# Patient Record
Sex: Male | Born: 1965 | Race: White | Hispanic: No | Marital: Single | Smoking: Current every day smoker
Health system: Southern US, Community
[De-identification: ages and names within clinical notes are randomized; demographics above are authoritative.]

## PROBLEM LIST (undated history)

## (undated) DIAGNOSIS — S069X9A Unspecified intracranial injury with loss of consciousness of unspecified duration, initial encounter: Secondary | ICD-10-CM

## (undated) DIAGNOSIS — F319 Bipolar disorder, unspecified: Secondary | ICD-10-CM

## (undated) DIAGNOSIS — R519 Headache, unspecified: Secondary | ICD-10-CM

## (undated) DIAGNOSIS — E039 Hypothyroidism, unspecified: Secondary | ICD-10-CM

## (undated) DIAGNOSIS — F259 Schizoaffective disorder, unspecified: Secondary | ICD-10-CM

## (undated) DIAGNOSIS — R011 Cardiac murmur, unspecified: Secondary | ICD-10-CM

## (undated) DIAGNOSIS — S069XAA Unspecified intracranial injury with loss of consciousness status unknown, initial encounter: Secondary | ICD-10-CM

## (undated) DIAGNOSIS — R51 Headache: Secondary | ICD-10-CM

---

## 2014-10-19 ENCOUNTER — Emergency Department
Admission: EM | Admit: 2014-10-19 | Discharge: 2014-10-19 | Disposition: A | Discharge status: Other Acute Inpatient Hospital | Payer: Medicare Other | Attending: Emergency Medicine | Admitting: Emergency Medicine

## 2014-10-19 ENCOUNTER — Encounter: Payer: Self-pay | Admitting: Emergency Medicine

## 2014-10-19 ENCOUNTER — Inpatient Hospital Stay
Admit: 2014-10-19 | Discharge: 2014-10-21 | DRG: 885 | Disposition: A | Discharge status: Home / Self Care | Payer: Medicare Other | Attending: Psychiatry | Admitting: Psychiatry

## 2014-10-19 DIAGNOSIS — F17200 Nicotine dependence, unspecified, uncomplicated: Secondary | ICD-10-CM | POA: Diagnosis present

## 2014-10-19 DIAGNOSIS — F250 Schizoaffective disorder, bipolar type: Secondary | ICD-10-CM | POA: Diagnosis present

## 2014-10-19 DIAGNOSIS — Z818 Family history of other mental and behavioral disorders: Secondary | ICD-10-CM

## 2014-10-19 DIAGNOSIS — Z79899 Other long term (current) drug therapy: Secondary | ICD-10-CM | POA: Insufficient documentation

## 2014-10-19 DIAGNOSIS — T1491 Suicide attempt: Secondary | ICD-10-CM | POA: Diagnosis present

## 2014-10-19 DIAGNOSIS — Z79818 Long term (current) use of other agents affecting estrogen receptors and estrogen levels: Secondary | ICD-10-CM | POA: Diagnosis not present

## 2014-10-19 DIAGNOSIS — S069XAA Unspecified intracranial injury with loss of consciousness status unknown, initial encounter: Secondary | ICD-10-CM

## 2014-10-19 DIAGNOSIS — R45850 Homicidal ideations: Secondary | ICD-10-CM | POA: Diagnosis present

## 2014-10-19 DIAGNOSIS — F29 Unspecified psychosis not due to a substance or known physiological condition: Secondary | ICD-10-CM | POA: Diagnosis present

## 2014-10-19 DIAGNOSIS — F17210 Nicotine dependence, cigarettes, uncomplicated: Secondary | ICD-10-CM | POA: Diagnosis present

## 2014-10-19 DIAGNOSIS — Z720 Tobacco use: Secondary | ICD-10-CM | POA: Insufficient documentation

## 2014-10-19 DIAGNOSIS — S069X9A Unspecified intracranial injury with loss of consciousness of unspecified duration, initial encounter: Secondary | ICD-10-CM

## 2014-10-19 DIAGNOSIS — F259 Schizoaffective disorder, unspecified: Secondary | ICD-10-CM | POA: Diagnosis not present

## 2014-10-19 DIAGNOSIS — F1510 Other stimulant abuse, uncomplicated: Secondary | ICD-10-CM | POA: Insufficient documentation

## 2014-10-19 DIAGNOSIS — F1310 Sedative, hypnotic or anxiolytic abuse, uncomplicated: Secondary | ICD-10-CM | POA: Insufficient documentation

## 2014-10-19 DIAGNOSIS — Z87820 Personal history of traumatic brain injury: Secondary | ICD-10-CM | POA: Insufficient documentation

## 2014-10-19 DIAGNOSIS — Z046 Encounter for general psychiatric examination, requested by authority: Secondary | ICD-10-CM | POA: Diagnosis present

## 2014-10-19 HISTORY — DX: Unspecified intracranial injury with loss of consciousness of unspecified duration, initial encounter: S06.9X9A

## 2014-10-19 HISTORY — DX: Headache: R51

## 2014-10-19 HISTORY — DX: Cardiac murmur, unspecified: R01.1

## 2014-10-19 HISTORY — DX: Schizoaffective disorder, unspecified: F25.9

## 2014-10-19 HISTORY — DX: Unspecified intracranial injury with loss of consciousness status unknown, initial encounter: S06.9XAA

## 2014-10-19 HISTORY — DX: Headache, unspecified: R51.9

## 2014-10-19 LAB — URINE DRUG SCREEN, QUALITATIVE (ARMC ONLY)
AMPHETAMINES, UR SCREEN: NOT DETECTED
BENZODIAZEPINE, UR SCRN: NOT DETECTED
Barbiturates, Ur Screen: NOT DETECTED
Cannabinoid 50 Ng, Ur ~~LOC~~: NOT DETECTED
Cocaine Metabolite,Ur ~~LOC~~: NOT DETECTED
MDMA (Ecstasy)Ur Screen: POSITIVE — AB
METHADONE SCREEN, URINE: NOT DETECTED
Opiate, Ur Screen: NOT DETECTED
Phencyclidine (PCP) Ur S: NOT DETECTED
Tricyclic, Ur Screen: POSITIVE — AB

## 2014-10-19 LAB — ACETAMINOPHEN LEVEL

## 2014-10-19 LAB — COMPREHENSIVE METABOLIC PANEL
ALBUMIN: 4.3 g/dL (ref 3.5–5.0)
ALT: 12 U/L — ABNORMAL LOW (ref 17–63)
ANION GAP: 7 (ref 5–15)
AST: 23 U/L (ref 15–41)
Alkaline Phosphatase: 71 U/L (ref 38–126)
BILIRUBIN TOTAL: 0.5 mg/dL (ref 0.3–1.2)
BUN: 18 mg/dL (ref 6–20)
CO2: 24 mmol/L (ref 22–32)
Calcium: 9.3 mg/dL (ref 8.9–10.3)
Chloride: 108 mmol/L (ref 101–111)
Creatinine, Ser: 0.94 mg/dL (ref 0.61–1.24)
GFR calc Af Amer: >60 mL/min (ref >60)
Glucose, Bld: 97 mg/dL (ref 65–99)
POTASSIUM: 3.5 mmol/L (ref 3.5–5.1)
Sodium: 139 mmol/L (ref 135–145)
TOTAL PROTEIN: 6.9 g/dL (ref 6.5–8.1)

## 2014-10-19 LAB — CBC
HCT: 42.9 % (ref 40.0–52.0)
Hemoglobin: 14.4 g/dL (ref 13.0–18.0)
MCH: 30.4 pg (ref 26.0–34.0)
MCHC: 33.5 g/dL (ref 32.0–36.0)
MCV: 90.7 fL (ref 80.0–100.0)
PLATELETS: 173 10*3/uL (ref 150–440)
RBC: 4.73 MIL/uL (ref 4.40–5.90)
RDW: 13.3 % (ref 11.5–14.5)
WBC: 11 10*3/uL — AB (ref 3.8–10.6)

## 2014-10-19 LAB — SALICYLATE LEVEL

## 2014-10-19 LAB — ETHANOL

## 2014-10-19 MED ORDER — PALIPERIDONE ER 3 MG PO TB24
6.0000 mg | ORAL_TABLET | Freq: Every day | ORAL | Status: DC
  Administered 2014-10-19: 6 mg via ORAL
  Filled 2014-10-19: qty 2
  Filled 2014-10-19: qty 1

## 2014-10-19 MED ORDER — HYDROXYZINE HCL 25 MG PO TABS
50.0000 mg | ORAL_TABLET | Freq: Three times a day (TID) | ORAL | Status: DC | PRN
  Administered 2014-10-19: 50 mg via ORAL
  Filled 2014-10-19: qty 2

## 2014-10-19 MED ORDER — LAMOTRIGINE 25 MG PO TABS
100.0000 mg | ORAL_TABLET | Freq: Two times a day (BID) | ORAL | Status: DC
  Administered 2014-10-19 (×2): 100 mg via ORAL
  Filled 2014-10-19: qty 4
  Filled 2014-10-19 (×4): qty 1

## 2014-10-19 MED ORDER — NICOTINE 10 MG IN INHA
1.0000 | RESPIRATORY_TRACT | Status: DC | PRN
  Administered 2014-10-19: 1 via RESPIRATORY_TRACT

## 2014-10-19 MED ORDER — MIRTAZAPINE 15 MG PO TABS
30.0000 mg | ORAL_TABLET | Freq: Every day | ORAL | Status: DC
Start: 2014-10-19 — End: 2014-10-19
  Administered 2014-10-19: 30 mg via ORAL
  Filled 2014-10-19: qty 1
  Filled 2014-10-19: qty 2
  Filled 2014-10-19: qty 1

## 2014-10-19 MED ORDER — NICOTINE 10 MG IN INHA
RESPIRATORY_TRACT | Status: AC
Start: 2014-10-19 — End: 2014-10-19
  Administered 2014-10-19: 168
  Filled 2014-10-19: qty 36

## 2014-10-19 MED ORDER — LORAZEPAM 1 MG PO TABS
ORAL_TABLET | ORAL | Status: DC
Start: 2014-10-19 — End: 2014-10-19
  Filled 2014-10-19: qty 1

## 2014-10-19 MED ORDER — OXCARBAZEPINE 300 MG PO TABS
600.0000 mg | ORAL_TABLET | Freq: Two times a day (BID) | ORAL | Status: DC
  Administered 2014-10-19 (×2): 600 mg via ORAL
  Filled 2014-10-19 (×6): qty 2

## 2014-10-19 NOTE — ED Notes (Signed)
Pt. transfered to [facility] without incident after report from. Placed in room and oriented to unit. Pt. informed that for their safety all care areas are designed for safety and monitored by security cameras at all times; and visiting hours explained to patient. Patient verbalizes understanding, and verbal contract for safety obtained.

## 2014-10-19 NOTE — ED Notes (Signed)
Am meds administered as ordered    Appropriate to stimulation  No verbalized needs or concerns at this time  NAD assessed  Continue to monitor 

## 2014-10-19 NOTE — ED Notes (Signed)

## 2014-10-19 NOTE — Consult Note (Signed)
[facility] Face-to-Face Psychiatry Consult   Reason for Consult:  Consult for this 49 year old man referred from his group home with homicidal ideation Referring Physician:  Marcelene Butte Patient Identification: Jaime Vargas MRN:  [id number] Principal Diagnosis: Schizoaffective disorder Diagnosis:   Patient Active Problem List   Diagnosis Date Noted  . Schizoaffective disorder [F25.9] 10/19/2014  . Traumatic brain injury [S06.9X0A] 10/19/2014    Total Time spent with patient: 1.5 hours  Subjective:   Jaime Vargas is a 49 y.o. male patient admitted with "they are trying to get my stuff".  HPI:  Information from the patient and the chart. This 48 year old man with a history of chronic mental health problems was referred from his group home. He states that he can't stand being at the group home anymore because they are trying to steal from him. Today he threatened to kill everyone at the group home. He doesn't actually have a weapon but he made multiple very agitated threats. He is convinced that people are jealous of him and are trying to steal his belongings from him. He says that he is going to get out of that group home in matter what he has to do. Denies any suicidal ideation. He has been sleeping poorly recently. Appetite is fine. Compliant with medicine. Denies alcohol or drug use. Has been living in the current group home for only a couple of months. No clear precipitant to his worsening paranoia.  Long-standing chronic mental health problems evidently. Diagnosis of schizoaffective disorder. Patient also reports a severe brain injury at age 61 which may be a major part of his ongoing symptoms. No history of suicide attempts. Denies that he is ever actually been violent anyone in the past. Has had several hospitalizations in the past.  Social history: Patient is unclear as to whether he has a guardian or not. He is currently residing at a local group home.  Medical history: Has a history of a brain  injury at age 28. He is on some anticonvulsives but it's not clear to me whether he's ever actually had seizures he doesn't give a clear history about that. No other obvious medical problems.  Substance abuse history: Says he doesn't drink or use drugs and never had any problem with alcohol or drugs.  Family history: Denies any family history of mental illness  Current medication: Lamotrigine, Remeron, Trileptal, in [place]   HPI Elements:   Quality:  Homicidal ideation threats and anger. Severity:  Severe potentially life threatening. Timing:  Long-standing chronic issue but with worsening in the last few days. Duration:  Ongoing threatening behavior. Context:  Chronic paranoia.  Past Medical History:  Past Medical History  Diagnosis Date  . Schizoaffective disorder   . Traumatic brain injury    History reviewed. No pertinent past surgical history. Family History: No family history on file. Social History:  History  Alcohol Use No     History  Drug Use No    Comment: Patient reports past use    Social History   Social History  . Marital Status: Divorced    Spouse Name: N/A  . Number of Children: N/A  . Years of Education: N/A   Social History Main Topics  . Smoking status: Current Every Day Smoker -- 2.00 packs/day  . Smokeless tobacco: Never Used  . Alcohol Use: No  . Drug Use: No     Comment: Patient reports past use  . Sexual Activity: Not Asked   Other Topics Concern  . None   Social  History Narrative  . None   Additional Social History:    History of alcohol / drug use?: No history of alcohol / drug abuse                     Allergies:  No Known Allergies  Labs:  Results for orders placed or performed during the hospital encounter of 10/19/14 (from the past 48 hour(s))  Comprehensive metabolic panel     Status: Abnormal   Collection Time: 10/19/14 12:31 AM  Result Value Ref Range   Sodium 139 135 - 145 mmol/L   Potassium 3.5 3.5 - 5.1 mmol/L    Chloride 108 101 - 111 mmol/L   CO2 24 22 - 32 mmol/L   Glucose, Bld 97 65 - 99 mg/dL   BUN 18 6 - 20 mg/dL   Creatinine, Ser 0.94 0.61 - 1.24 mg/dL   Calcium 9.3 8.9 - 10.3 mg/dL   Total Protein 6.9 6.5 - 8.1 g/dL   Albumin 4.3 3.5 - 5.0 g/dL   AST 23 15 - 41 U/L   ALT 12 (L) 17 - 63 U/L   Alkaline Phosphatase 71 38 - 126 U/L   Total Bilirubin 0.5 0.3 - 1.2 mg/dL   GFR calc non Af Amer >60 >60 mL/min   GFR calc Af Amer >60 >60 mL/min    Comment: (NOTE) The eGFR has been calculated using the CKD EPI equation. This calculation has not been validated in all clinical situations. eGFR's persistently <60 mL/min signify possible Chronic Kidney Disease.    Anion gap 7 5 - 15  Ethanol (ETOH)     Status: None   Collection Time: 10/19/14 12:31 AM  Result Value Ref Range   Alcohol, Ethyl (B) <5 <5 mg/dL    Comment:        LOWEST DETECTABLE LIMIT FOR SERUM ALCOHOL IS 5 mg/dL FOR MEDICAL PURPOSES ONLY   Salicylate level     Status: None   Collection Time: 10/19/14 12:31 AM  Result Value Ref Range   Salicylate Lvl <5.4 2.8 - 30.0 mg/dL  Acetaminophen level     Status: Abnormal   Collection Time: 10/19/14 12:31 AM  Result Value Ref Range   Acetaminophen (Tylenol), Serum <10 (L) 10 - 30 ug/mL    Comment:        THERAPEUTIC CONCENTRATIONS VARY SIGNIFICANTLY. A RANGE OF 10-30 ug/mL MAY BE AN EFFECTIVE CONCENTRATION FOR MANY PATIENTS. HOWEVER, SOME ARE BEST TREATED AT CONCENTRATIONS OUTSIDE THIS RANGE. ACETAMINOPHEN CONCENTRATIONS >150 ug/mL AT 4 HOURS AFTER INGESTION AND >50 ug/mL AT 12 HOURS AFTER INGESTION ARE OFTEN ASSOCIATED WITH TOXIC REACTIONS.   CBC     Status: Abnormal   Collection Time: 10/19/14 12:31 AM  Result Value Ref Range   WBC 11.0 (H) 3.8 - 10.6 K/uL   RBC 4.73 4.40 - 5.90 MIL/uL   Hemoglobin 14.4 13.0 - 18.0 g/dL   HCT 42.9 40.0 - 52.0 %   MCV 90.7 80.0 - 100.0 fL   MCH 30.4 26.0 - 34.0 pg   MCHC 33.5 32.0 - 36.0 g/dL   RDW 13.3 11.5 - 14.5 %    Platelets 173 150 - 440 K/uL  Urine Drug Screen, Qualitative ([place] only)     Status: Abnormal   Collection Time: 10/19/14 12:31 AM  Result Value Ref Range   Tricyclic, Ur Screen POSITIVE (A) NONE DETECTED   Amphetamines, Ur Screen NONE DETECTED NONE DETECTED   MDMA (Ecstasy)Ur Screen POSITIVE (A) NONE DETECTED  Cocaine Metabolite,Ur [place] NONE DETECTED NONE DETECTED   Opiate, Ur Screen NONE DETECTED NONE DETECTED   Phencyclidine (PCP) Ur S NONE DETECTED NONE DETECTED   Cannabinoid 50 Ng, Ur [place] NONE DETECTED NONE DETECTED   Barbiturates, Ur Screen NONE DETECTED NONE DETECTED   Benzodiazepine, Ur Scrn NONE DETECTED NONE DETECTED   Methadone Scn, Ur NONE DETECTED NONE DETECTED    Comment: (NOTE) 875  Tricyclics, urine               Cutoff 1000 ng/mL 200  Amphetamines, urine             Cutoff 1000 ng/mL 300  MDMA (Ecstasy), urine           Cutoff 500 ng/mL 400  Cocaine Metabolite, urine       Cutoff 300 ng/mL 500  Opiate, urine                   Cutoff 300 ng/mL 600  Phencyclidine (PCP), urine      Cutoff 25 ng/mL 700  Cannabinoid, urine              Cutoff 50 ng/mL 800  Barbiturates, urine             Cutoff 200 ng/mL 900  Benzodiazepine, urine           Cutoff 200 ng/mL 1000 Methadone, urine                Cutoff 300 ng/mL 1100 1200 The urine drug screen provides only a preliminary, unconfirmed 1300 analytical test result and should not be used for non-medical 1400 purposes. Clinical consideration and professional judgment should 1500 be applied to any positive drug screen result due to possible 1600 interfering substances. A more specific alternate chemical method 1700 must be used in order to obtain a confirmed analytical result.  1800 Gas chromato graphy / mass spectrometry (GC/MS) is the preferred 1900 confirmatory method.     Vitals: Blood pressure 114/82, pulse 82, temperature 97.6 F (36.4 C), temperature source Oral, resp. rate 18, height _0  (1.753 m), weight 66.821 kg  (147 lb 5 oz), SpO2 98 %.  Risk to Self: Suicidal Ideation: No Suicidal Intent: No Is patient at risk for suicide?: No Suicidal Plan?: No Access to Means: No What has been your use of drugs/alcohol within the last 12 months?: None reported How many times?: 0 Other Self Harm Risks: None reported Triggers for Past Attempts: None known Intentional Self Injurious Behavior: None Risk to Others: Homicidal Ideation: No Thoughts of Harm to Others: No Current Homicidal Intent: No Current Homicidal Plan: No Access to Homicidal Means: No Identified Victim: N/A History of harm to others?: No Assessment of Violence: None Noted Violent Behavior Description: N/A Does patient have access to weapons?: No Criminal Charges Pending?: No Does patient have a court date: No Prior Inpatient Therapy: Prior Inpatient Therapy: No Prior Therapy Dates: N/A Prior Therapy Facilty/Provider(s): N/A Reason for Treatment: N/A Prior Outpatient Therapy: Prior Outpatient Therapy: No Prior Therapy Dates: N/A Prior Therapy Facilty/Provider(s): N/A Reason for Treatment: N/A Does patient have an ACCT team?: No Does patient have Intensive In-House Services?  : No Does patient have [facility] services? : No Does patient have P4CC services?: No  Current Facility-Administered Medications  Medication Dose Route Frequency Provider Last Rate Last Dose  . hydrOXYzine (ATARAX/VISTARIL) tablet 50 mg  50 mg Oral TID PRN Joanne Gavel, MD   50 mg at 10/19/14 0818  . lamoTRIgine (LAMICTAL)  tablet 100 mg  100 mg Oral BID Joanne Gavel, MD   100 mg at 10/19/14 1018  . mirtazapine (REMERON) tablet 30 mg  30 mg Oral QHS Joanne Gavel, MD      . nicotine (NICOTROL) 10 MG inhaler 1 continuous puffing  1 continuous puffing Inhalation PRN Paulette Blanch, MD   1 continuous puffing at 10/19/14 1726  . Oxcarbazepine (TRILEPTAL) tablet 600 mg  600 mg Oral BID Joanne Gavel, MD   600 mg at 10/19/14 1016  . paliperidone (INVEGA) 24 hr tablet 6 mg   6 mg Oral Daily Joanne Gavel, MD   6 mg at 10/19/14 1017   Current Outpatient Prescriptions  Medication Sig Dispense Refill  . hydrOXYzine (ATARAX/VISTARIL) 50 MG tablet Take 50 mg by mouth 3 (three) times daily as needed.    . lamoTRIgine (LAMICTAL) 100 MG tablet Take 100 mg by mouth 2 (two) times daily.    . mirtazapine (REMERON) 30 MG tablet Take 30 mg by mouth at bedtime.    Marland Kitchen oxcarbazepine (TRILEPTAL) 600 MG tablet Take 600 mg by mouth 2 (two) times daily.    . paliperidone (INVEGA) 6 MG 24 hr tablet Take 6 mg by mouth daily.      Musculoskeletal: Strength & Muscle Tone: within normal limits Gait & Station: normal Patient leans: N/A  Psychiatric Specialty Exam: Physical Exam  Nursing note and vitals reviewed. Constitutional: He appears well-developed and well-nourished.  HENT:  Head: Atraumatic.    Eyes: Conjunctivae are normal. Pupils are equal, round, and reactive to light.  Neck: Normal range of motion.  Cardiovascular: Normal heart sounds.   Respiratory: Effort normal.  GI: Soft.  Musculoskeletal: Normal range of motion.  Neurological: He is alert.  Skin: Skin is warm and dry.  Psychiatric: His mood appears anxious. His affect is labile. His speech is rapid and/or pressured. He is agitated. Thought content is paranoid. Cognition and memory are impaired. He expresses impulsivity. He expresses homicidal ideation. He exhibits abnormal recent memory and abnormal remote memory.    Review of Systems  Constitutional: Negative.   HENT: Negative.   Eyes: Negative.   Respiratory: Negative.   Cardiovascular: Negative.   Gastrointestinal: Negative.   Musculoskeletal: Negative.   Skin: Negative.   Neurological: Negative.   Psychiatric/Behavioral: Positive for hallucinations and memory loss. Negative for depression, suicidal ideas and substance abuse. The patient is nervous/anxious and has insomnia.     Blood pressure 114/82, pulse 82, temperature 97.6 F (36.4 C),  temperature source Oral, resp. rate 18, height _0  (1.753 m), weight 66.821 kg (147 lb 5 oz), SpO2 98 %.Body mass index is 21.74 kg/(m^2).  General Appearance: Casual  Eye Contact::  Good  Speech:  Pressured  Volume:  Increased  Mood:  Angry  Affect:  Congruent  Thought Process:  Circumstantial  Orientation:  Full (Time, Place, and Person)  Thought Content:  Delusions and Paranoid Ideation  Suicidal Thoughts:  No  Homicidal Thoughts:  Yes.  without intent/plan  Memory:  Immediate;   Fair Recent;   Poor Remote;   Poor  Judgement:  Impaired  Insight:  Lacking  Psychomotor Activity:  Increased  Concentration:  Poor  Recall:  Poor  Fund of Knowledge:Poor  Language: Poor  Akathisia:  No  Handed:  Right  AIMS (if indicated):     Assets:  Housing Resilience  ADL's:  Intact  Cognition: Impaired,  Mild  Sleep:      Medical Decision Making: Review  of Psycho-Social Stressors (1), Review or order clinical lab tests (1), Established Problem, Worsening (2) and Review of Medication Regimen & Side Effects (2)  Treatment Plan Summary: Daily contact with patient to assess and evaluate symptoms and progress in treatment, Medication management and Plan Patient is agitated and paranoid. Delusional. Tells me that he has $250 million. Seems to sometimes just confabulate things. Pretty agitated and repeats his threats if he has to go back to the group home. Patient will be admitted to the psychiatry service the diagnosis of schizoaffective disorder. 15 minute checks in place. Continue current medicine. Treatment team downstairs can work with him on further medication adjustments and further planning. Continue IVC  Plan:  Recommend psychiatric Inpatient admission when medically cleared. Supportive therapy provided about ongoing stressors. Disposition: Admit to psychiatric ward  Nunez 10/19/2014 8:04 PM

## 2014-10-19 NOTE — ED Notes (Signed)
BEHAVIORAL HEALTH ROUNDING Patient sleeping: No. Patient alert and oriented: yes Behavior appropriate: Yes.  ; If no, describe:  Nutrition and fluids offered: yes Toileting and hygiene offered: Yes  Sitter present: q15 minute observations and security camera monitoring Law enforcement present: Yes  [facility]  

## 2014-10-19 NOTE — ED Notes (Signed)
Breakfast provided along with grape juice   introduced myself to him and he was very polite   He denies pain  Assessment completed  Psych consult pending  VSS

## 2014-10-19 NOTE — ED Notes (Signed)
Report called to beh med RN,  Pt admitted.

## 2014-10-19 NOTE — ED Notes (Signed)
BEHAVIORAL HEALTH ROUNDING Patient sleeping: No. Patient alert and oriented: yes Behavior appropriate: Yes.   Nutrition and fluids offered: Yes  Toileting and hygiene offered: Yes  Sitter present: q15 min observations Law enforcement present: Yes [employer]  ENVIRONMENTAL ASSESSMENT Potentially harmful objects out of patient reach: Yes.   Personal belongings secured: Yes.   Patient dressed in hospital provided attire only: Yes.   Plastic bags out of patient reach: Yes.   Patient care equipment (cords, cables, call bells, lines, and drains) shortened, removed, or accounted for: Yes.   Equipment and supplies removed from bottom of stretcher: Yes.   Potentially toxic materials out of patient reach: Yes.   Sharps container removed or out of patient reach: Yes.   ED BHU PLACEMENT JUSTIFICATION Is the patient under IVC or is there intent for IVC: Yes.    Is IVC current? Yes Is the patient medically cleared: Yes.   Is there vacancy in the ED BHU: Yes.   Is the population mix appropriate for patient: Yes.   Is the patient awaiting placement in inpatient or outpatient setting: return to group home.   Has the patient had a psychiatric consult:    Survey of unit performed for contraband, proper placement and condition of furniture, tampering with fixtures in bathroom, shower, and each patient room: Yes.  ; Findings: none APPEARANCE/BEHAVIOR Cooperative,calm NEURO ASSESSMENT Orientation: A&O x3 Hallucinations: none noted at this time Speech: Normal Gait: normal RESPIRATORY ASSESSMENT Breathing Pattern-regular, no respiratory distress noted CARDIOVASCULAR ASSESSMENT Skin color appropriate for age and race GASTROINTESTINAL ASSESSMENT no GI distress noted EXTREMITIES Moves all extremities, no distress noted PLAN OF CARE Provide calm/safe environment. Vital signs assessed twice daily. ED BHU Assessment once each 12-hour shift. Collaborate with intake RN daily or as condition indicates.  Assure the ED provider has rounded once each shift. Provide and encourage hygiene. Provide redirection as needed. Assess for escalating behavior; address immediately and inform ED provider.  Assess family dynamic and appropriateness for visitation as needed: Yes.  Educate the patient/family about BHU procedures/visitation: Yes.

## 2014-10-19 NOTE — ED Notes (Signed)
Report received from Beth RN. Patient care assumed. Patient/RN introduction complete. Will continue to monitor.  

## 2014-10-19 NOTE — ED Notes (Signed)
Patient resting comfortably in room. No complaints or concerns voiced. No distress or abnormal behavior noted. Will continue to monitor with security cameras. Q 15 minute rounds continue. 

## 2014-10-19 NOTE — ED Notes (Signed)

## 2014-10-19 NOTE — ED Notes (Signed)
Pt calm  And cooperative at this time without complaints, will continue to monitor.  

## 2014-10-19 NOTE — ED Notes (Signed)
He is standing in the doorway of his room during report  - he appears angry  History of TBI

## 2014-10-19 NOTE — ED Notes (Signed)
Pt given breakfast tray

## 2014-10-19 NOTE — ED Notes (Signed)

## 2014-10-19 NOTE — ED Notes (Signed)
Pt brought in with [place] sherriff with ivc. Pt a resident of [facility] in [place]. Per petition, pt is threatening other residents. Pt with history of szhizoaffective disorder. Pt denies si or hi.
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
## 2014-10-19 NOTE — ED Notes (Signed)
Patient reports not feeling angry any more and states he know longer feels like he would hurt staff.  MD notified.

## 2014-10-19 NOTE — ED Notes (Signed)

## 2014-10-19 NOTE — ED Notes (Signed)
Report called to RN Raquel in [place].   Pt moved to [facility] with ED Thana Ates and Officer.

## 2014-10-19 NOTE — ED Notes (Signed)
Lunch provided along with an extra drink   Appropriate to stimulation  No verbalized needs or concerns at this time  NAD assessed  Continue to monitor 

## 2014-10-19 NOTE — ED Notes (Signed)
He continues to talk loud to the officers and gentle to medical staff  - officer encouraged to stop staring at him and talking back to him  Pt reassured

## 2014-10-19 NOTE — ED Notes (Signed)
He is to be admitted to [facility] when staff/bed become available

## 2014-10-19 NOTE — ED Notes (Signed)
BEHAVIORAL HEALTH ROUNDING Patient sleeping: No. Patient alert and oriented: yes Behavior appropriate: Yes.  ; If no, describe: Patient initially hostile with staff, but calmed after a few minutes Nutrition and fluids offered: Yes  Toileting and hygiene offered: Yes  Sitter present: no Law enforcement present: Yes

## 2014-10-19 NOTE — ED Notes (Signed)
Supper provided along with an extra drink    Appropriate to stimulation  No verbalized needs or concerns at this time  NAD assessed  Continue to monitor 

## 2014-10-19 NOTE — ED Notes (Signed)
BEHAVIORAL HEALTH ROUNDING Patient sleeping: no Patient alert and oriented: yes Behavior appropriate: Yes.  ; If no, describe:  Nutrition and fluids offered: No Toileting and hygiene offered: Yes  Sitter present: no Law enforcement present: Yes

## 2014-10-19 NOTE — ED Notes (Signed)
ED BHU PLACEMENT JUSTIFICATION Is the patient under IVC or is there intent for IVC: Yes.   Is the patient medically cleared: Yes.   Is there vacancy in the ED BHU: Yes.   Is the population mix appropriate for patient: Yes.   Is the patient awaiting placement in inpatient or outpatient setting: No. Has the patient had a psychiatric consult: consult pending Survey of unit performed for contraband, proper placement and condition of furniture, tampering with fixtures in bathroom, shower, and each patient room: Yes.  ; Findings:  APPEARANCE/BEHAVIOR Calm and cooperative NEURO ASSESSMENT Orientation: oriented x3  Denies pain Hallucinations: No.None noted (Hallucinations) Speech: Normal Gait: normal RESPIRATORY ASSESSMENT even unlabored respirations  CARDIOVASCULAR ASSESSMENT Pulses equal  regular rate  Skin warm and dry GASTROINTESTINAL ASSESSMENT no GI complaint EXTREMITIES Full ROM PLAN OF CARE Provide calm/safe environment. Vital signs assessed twice daily. ED BHU Assessment once each 12-hour shift. Collaborate with intake RN daily or as condition indicates. Assure the ED provider has rounded once each shift. Provide and encourage hygiene. Provide redirection as needed. Assess for escalating behavior; address immediately and inform ED provider.  Assess family dynamic and appropriateness for visitation as needed: Yes.  ; If necessary, describe findings:  Educate the patient/family about BHU procedures/visitation: Yes.  ; If necessary, describe findings:   

## 2014-10-19 NOTE — ED Notes (Signed)
BEHAVIORAL HEALTH ROUNDING Patient sleeping: No. Patient alert and oriented: yes Behavior appropriate: Yes.  ; If no, describe:  Nutrition and fluids offered: No Toileting and hygiene offered: Yes  Sitter present: no Law enforcement present: Yes  

## 2014-10-19 NOTE — ED Notes (Signed)
md Clapacs is consulting at this time   

## 2014-10-19 NOTE — ED Notes (Signed)
Report received from RN Amy T  Pt in doorway of room watching activity around QUAD area. No complaints or concerns voiced at this time. No abnormal behavior noted at this time. Will continue to monitor with q15 min checks. ODS officer in area.
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
## 2014-10-19 NOTE — ED Notes (Signed)
Called pharmacy for remaining meds  -  PRN med administered due to he has been pacing circles around his bed in his room - at times yelling  "F--k you - I am gonna wait till daylight and then I am gonna run - go get my shoes - whoever will not bring me my shoes can go to hell."

## 2014-10-19 NOTE — ED Notes (Addendum)
BEHAVIORAL HEALTH ROUNDING Patient sleeping: No. Patient alert and oriented: yes Behavior appropriate: no  ; If no, describe:  Agitated - talking loud at officers by the door  Nutrition and fluids offered: yes Toileting and hygiene offered: Yes  Sitter present: q15 minute observations and security camera monitoring Law enforcement present: Yes  ODS  ENVIRONMENTAL ASSESSMENT Potentially harmful objects out of patient reach: Yes.   Personal belongings secured: Yes.   Patient dressed in hospital provided attire only: Yes.   Plastic bags out of patient reach: Yes.   Patient care equipment (cords, cables, call bells, lines, and drains) shortened, removed, or accounted for: Yes.   Equipment and supplies removed from bottom of stretcher: Yes.   Potentially toxic materials out of patient reach: Yes.   Sharps container removed or out of patient reach: Yes.

## 2014-10-19 NOTE — ED Notes (Signed)
Patient assigned to appropriate care area. Patient oriented to unit/care area: Informed that, for their safety, care areas are designed for safety and monitored by security cameras at all times; and visiting hours explained to patient. Patient verbalizes understanding, and verbal contract for safety obtained. 

## 2014-10-20 DIAGNOSIS — F250 Schizoaffective disorder, bipolar type: Principal | ICD-10-CM

## 2014-10-20 DIAGNOSIS — F17200 Nicotine dependence, unspecified, uncomplicated: Secondary | ICD-10-CM | POA: Diagnosis present

## 2014-10-20 MED ORDER — OXCARBAZEPINE 300 MG PO TABS
600.0000 mg | ORAL_TABLET | Freq: Two times a day (BID) | ORAL | Status: DC
  Administered 2014-10-20 – 2014-10-21 (×3): 600 mg via ORAL
  Filled 2014-10-20 (×3): qty 2

## 2014-10-20 MED ORDER — DIVALPROEX SODIUM 500 MG PO DR TAB
500.0000 mg | DELAYED_RELEASE_TABLET | Freq: Two times a day (BID) | ORAL | Status: DC
  Administered 2014-10-20 – 2014-10-21 (×2): 500 mg via ORAL
  Filled 2014-10-20 (×2): qty 1

## 2014-10-20 MED ORDER — LAMOTRIGINE 100 MG PO TABS: 100.0000 mg | ORAL_TABLET | Freq: Every day | ORAL | Status: DC

## 2014-10-20 MED ORDER — ACETAMINOPHEN 325 MG PO TABS: 650.0000 mg | ORAL_TABLET | Freq: Four times a day (QID) | ORAL | Status: DC | PRN

## 2014-10-20 MED ORDER — LORAZEPAM 2 MG PO TABS
2.0000 mg | ORAL_TABLET | Freq: Once | ORAL | Status: AC
  Administered 2014-10-20: 2 mg via ORAL
  Filled 2014-10-20: qty 1

## 2014-10-20 MED ORDER — DIPHENHYDRAMINE HCL 25 MG PO CAPS
50.0000 mg | ORAL_CAPSULE | Freq: Once | ORAL | Status: AC
  Administered 2014-10-20: 50 mg via ORAL
  Filled 2014-10-20: qty 2

## 2014-10-20 MED ORDER — ALUM & MAG HYDROXIDE-SIMETH 200-200-20 MG/5ML PO SUSP: 30.0000 mL | ORAL | Status: DC | PRN

## 2014-10-20 MED ORDER — PALIPERIDONE ER 3 MG PO TB24
6.0000 mg | ORAL_TABLET | Freq: Every day | ORAL | Status: DC
  Administered 2014-10-20 – 2014-10-21 (×2): 6 mg via ORAL
  Filled 2014-10-20 (×2): qty 2

## 2014-10-20 MED ORDER — HALOPERIDOL 5 MG PO TABS
5.0000 mg | ORAL_TABLET | Freq: Once | ORAL | Status: AC
  Administered 2014-10-20: 5 mg via ORAL
  Filled 2014-10-20: qty 1

## 2014-10-20 MED ORDER — MAGNESIUM HYDROXIDE 400 MG/5ML PO SUSP: 30.0000 mL | Freq: Every day | ORAL | Status: DC | PRN

## 2014-10-20 MED ORDER — HYDROXYZINE HCL 50 MG PO TABS
50.0000 mg | ORAL_TABLET | Freq: Three times a day (TID) | ORAL | Status: DC | PRN
  Administered 2014-10-20: 50 mg via ORAL
  Filled 2014-10-20: qty 1

## 2014-10-20 MED ORDER — MIRTAZAPINE 30 MG PO TABS
30.0000 mg | ORAL_TABLET | Freq: Every day | ORAL | Status: DC
  Administered 2014-10-20: 30 mg via ORAL
  Filled 2014-10-20: qty 1

## 2014-10-20 NOTE — Tx Team (Signed)
Initial Interdisciplinary Treatment Plan   PATIENT STRESSORS: Health problems Substance abuse   PATIENT STRENGTHS: Ability for insight Motivation for treatment/growth   PROBLEM LIST: Problem List/Patient Goals Date to be addressed Date deferred Reason deferred Estimated date of resolution  Depression 10/20/14     Substance abuse 10/20/14                                                DISCHARGE CRITERIA:  Adequate post-discharge living arrangements Verbal commitment to aftercare and medication compliance  PRELIMINARY DISCHARGE PLAN: Return to previous living arrangement  PATIENT/FAMIILY INVOLVEMENT: This treatment plan has been presented to and reviewed with the patient, Jaime Vargas, and/or family member.  The patient and family have been given the opportunity to ask questions and make suggestions.  Foster Simpson 10/20/2014, 2:44 AM
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
## 2014-10-20 NOTE — [facility] Suicide Risk Assessment (Signed)
[facility] Admission Suicide Risk Assessment   Nursing information obtained from:    Demographic factors:    Current Mental Status:    Loss Factors:    Historical Factors:    Risk Reduction Factors:    Total Time spent with patient: 1 hour Principal Problem: Schizoaffective disorder, bipolar type Diagnosis:   Patient Active Problem List   Diagnosis Date Noted  . Tobacco use disorder [Z72.0] 10/20/2014  . Schizoaffective disorder, bipolar type [F25.0] 10/19/2014  . Traumatic brain injury [S06.9X0A] 10/19/2014     Continued Clinical Symptoms:  Alcohol Use Disorder Identification Test Final Score (AUDIT): 0 The "Alcohol Use Disorders Identification Test", Guidelines for Use in Primary Care, Second Edition.  World Science writer [facility]). Score between 0-7:  no or low risk or alcohol related problems. Score between 8-15:  moderate risk of alcohol related problems. Score between 16-19:  high risk of alcohol related problems. Score 20 or above:  warrants further diagnostic evaluation for alcohol dependence and treatment.   CLINICAL FACTORS:   Schizophrenia:   Depressive state Paranoid or undifferentiated type   Musculoskeletal: Strength & Muscle Tone: within normal limits Gait & Station: normal Patient leans: N/A  Psychiatric Specialty Exam: Physical exam performed in the emergency room and agree with the findings. Physical Exam  Nursing note and vitals reviewed.   Review of Systems  All other systems reviewed and are negative.   Blood pressure 102/70, pulse 92, temperature 97.8 F (36.6 C), temperature source Oral, resp. rate 18, height  (1.753 m), weight 66.225 kg (146 lb), SpO2 98 %.Body mass index is 21.55 kg/(m^2).  General Appearance: Disheveled  Eye Solicitor::  Fair  Speech:  Slurred  Volume:  Normal  Mood:  Euthymic  Affect:  Appropriate  Thought Process:  Goal Directed  Orientation:  Full (Time, Place, and Person)  Thought Content:  WDL  Suicidal Thoughts:   No  Homicidal Thoughts:  No  Memory:  Immediate;   Fair Recent;   Fair Remote;   Fair  Judgement:  Fair  Insight:  Shallow  Psychomotor Activity:  Normal  Concentration:  Fair  Recall:  [employer] of Knowledge:Fair  Language: Fair  Akathisia:  No  Handed:  Right  AIMS (if indicated):     Assets:  Communication Skills Desire for Improvement Financial Resources/Insurance Housing Physical Health Resilience Social Support  Sleep:  Number of Hours: 1.15  Cognition: WNL  ADL's:  Intact     COGNITIVE FEATURES THAT CONTRIBUTE TO RISK:  None    SUICIDE RISK:   Mild:  Suicidal ideation of limited frequency, intensity, duration, and specificity.  There are no identifiable plans, no associated intent, mild dysphoria and related symptoms, good self-control (both objective and subjective assessment), few other risk factors, and identifiable protective factors, including available and accessible social support.  PLAN OF CARE: Hospital admission, medication management, discharge planning.  Medical Decision Making:  New problem, with additional work up planned, Review of Psycho-Social Stressors (1), Review or order clinical lab tests (1), Review of Medication Regimen & Side Effects (2) and Review of New Medication or Change in Dosage (2)   Jaime Vargas is a 49 year old male with history of schizoaffective disorder admitted for making homicidal threats and agitated behavior at the group home.  1. Homicidal threats. This has resolved. The patient no longer feels homicidal. He spoke with the group home owner yesterday and wants to be discharged back there. The patient was agitated in the emergency room but no unwanted behaviors  were observed on the unit.  2. Mood and psychosis. We will continue Lamictal and Trileptal for mood stabilization, Remeron for depression and intubated for psychosis.  3. Smoking. He smokes 2 packs a day, nicotine patch is available.  4. Disposition. The patient will  be discharged back to the [facility] assisted living facility he will follow up with his regular psychiatrist  I certify that inpatient services furnished can reasonably be expected to improve the patient's condition.   Jolanta Pucilowska 10/20/2014, 12:28 PM
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
## 2014-10-20 NOTE — Progress Notes (Signed)
Recreation Therapy Notes  Date: 09.20.16 Time: 3:00 pm Location: Craft Room  Group Topic: Self-expression  Goal Area(s) Addresses:  Patient will be able to identify a color that represents each emotion. Patient will verbalize benefit of using art as a means of self-expression.  Behavioral Response: Arrived late, Attentive  Intervention: The Colors Within Me  Activity: Patients were given a blank face worksheet and instructed to pick a color for each emotion they were experiencing and show how much of that color they were feeling on the face.  Education: LRT educated patients on different forms of self-expression   Education Outcome: In group clarification offered  Clinical Observations/Feedback: Patient arrived to group at approximately 3:13 pm and left group at approximately 3:14 pm. Patient returned to group at approximately 3:26 pm. Patient contributed to group discussion by stating things he can do to help him keep positive emotions.  Jacquelynn Cree, LRT/CTRS 10/20/2014 4:47 PM

## 2014-10-20 NOTE — [facility] Group Notes (Signed)
[facility] Group Notes:  (Nursing/MHT/Case Management/Adjunct)  Date:  10/20/2014  Time:  10:46 PM  Type of Therapy:  Group Therapy  Participation Level:  Active  Participation Quality:  Appropriate  Affect:  Appropriate  Cognitive:  Appropriate  Insight:  Appropriate  Engagement in Group:  Engaged  Modes of Intervention:  Discussion  Summary of Progress/Problems:  Burt Ek 10/20/2014, 10:46 PM

## 2014-10-20 NOTE — [facility] Group Notes (Signed)
[facility] Group Notes:  (Nursing/MHT/Case Management/Adjunct)  Date:  10/20/2014  Time:  1:59 PM  Type of Therapy:  Psychoeducational Skills  Participation Level:  Did Not Attend    Jaime Vargas 10/20/2014, 1:59 PM

## 2014-10-20 NOTE — Plan of Care (Signed)
Problem: Alteration in thought process Goal: LTG-Patient behavior demonstrates decreased signs psychosis (Patient behavior demonstrates decreased signs of psychosis to the point the patient is safe to return home and continue treatment in an outpatient setting.)  Outcome: Progressing Patient denies hallucination. Thoughts and behaviors appear organized. No evidence of psychosis.

## 2014-10-20 NOTE — Progress Notes (Signed)
Patient remains agitated and  upset Pacing up and the down halls, verbally threatening staff calling them bitches and giving  the finger. Unable to redirect as he paces walking faster and faster talking to himself. Dr. Mayford Knife notified. N.O. Benadryl 50 mg,  Ativan 2 mg and Haldol 5 mg PO once. Medications given as ordered. Will continue to monitor behavior.

## 2014-10-21 LAB — HEMOGLOBIN A1C: Hgb A1c MFr Bld: 5.4 % (ref 4.0–6.0)

## 2014-10-21 LAB — LIPID PANEL
CHOL/HDL RATIO: 4.5 ratio
CHOLESTEROL: 153 mg/dL (ref 0–200)
HDL: 34 mg/dL — AB (ref >40)
LDL Cholesterol: 104 mg/dL — ABNORMAL HIGH (ref 0–99)
TRIGLYCERIDES: 77 mg/dL (ref <150)
VLDL: 15 mg/dL (ref 0–40)

## 2014-10-21 LAB — TSH: TSH: 1.817 u[IU]/mL (ref 0.350–4.500)

## 2014-10-21 MED ORDER — DIVALPROEX SODIUM 500 MG PO DR TAB: 500.0000 mg | DELAYED_RELEASE_TABLET | Freq: Two times a day (BID) | ORAL | Status: DC

## 2014-10-21 MED ORDER — OXCARBAZEPINE 600 MG PO TABS: 600.0000 mg | ORAL_TABLET | Freq: Two times a day (BID) | ORAL | Status: DC

## 2014-10-21 MED ORDER — PALIPERIDONE ER 6 MG PO TB24: 6.0000 mg | ORAL_TABLET | Freq: Every day | ORAL | Status: DC

## 2014-10-21 MED ORDER — HYDROXYZINE HCL 50 MG PO TABS: 50.0000 mg | ORAL_TABLET | Freq: Three times a day (TID) | ORAL | Status: DC | PRN

## 2014-10-21 MED ORDER — MIRTAZAPINE 30 MG PO TABS: 30.0000 mg | ORAL_TABLET | Freq: Every day | ORAL | Status: DC

## 2014-10-21 MED ORDER — NICOTINE 14 MG/24HR TD PT24
14.0000 mg | MEDICATED_PATCH | Freq: Every day | TRANSDERMAL | Status: DC
  Administered 2014-10-21: 14 mg via TRANSDERMAL
  Filled 2014-10-21: qty 1

## 2014-10-21 NOTE — BHH Group Notes (Signed)
[facility] LCSW Aftercare Discharge Planning Group Note   10/21/2014 12:02 PM  Participation Quality:  Patient attended and participated in group discussion sharing his SMART goal is to discharge today. Patient also stated that he wanted to shoot people at the group home when they made him mad. Patient met with CSW after group and discussed that he did not have access to a gun and that he would not react by hurting anyone if they made him angry and showed an understanding that his voiced reaction does not match the way he should respond when someone makes him angry. Patient was able to identify coping skills of talking to someone and walking.   Mood/Affect:  Defensive and Labile  Thoughts of Suicide:  No Will you contract for safety?   Yes  Current AVH:  No  Plan for Discharge/Comments:  Patient will return to group home and followup at [employer] [facility]: Group home will provide transportation  Supports: Patient has mental health followup, group home, and a supportive brother.  Keene Breath, MSW, LCSWA

## 2014-10-21 NOTE — BHH Suicide Risk Assessment (Signed)
B[facility] INPATIENT:  Family/Significant Other Suicide Prevention Education  Suicide Prevention Education:  Education Completed; [facility] (group home) [phone] has been identified by the patient as the family member/significant other with whom the patient will be residing, and identified as the person(s) who will aid the patient in the event of a mental health crisis (suicidal ideations/suicide attempt).  With written consent from the patient, the family member/significant other has been provided the following suicide prevention education, prior to the and/or following the discharge of the patient.  The suicide prevention education provided includes the following:  Suicide risk factors  Suicide prevention and interventions  National Suicide Hotline telephone number  [facility] assessment telephone number  [facility] Emergency Assistance 911  [facility] and/or Residential Mobile Crisis Unit telephone number  Request made of family/significant other to:  Remove weapons (e.g., guns, rifles, knives), all items previously/currently identified as safety concern.    Remove drugs/medications (over-the-counter, prescriptions, illicit drugs), all items previously/currently identified as a safety concern.  The family member/significant other verbalizes understanding of the suicide prevention education information provided.  The family member/significant other agrees to remove the items of safety concern listed above.  Lulu Riding, MSW, LCSWA 10/21/2014, 11:57 AM

## 2014-10-21 NOTE — BHH Counselor (Signed)
Adult Comprehensive Assessment  Patient ID: Jaime Vargas, male   DOB: 1965-11-07, 49 y.o.   MRN: [id number]  Information Source: Information source: Patient  Current Stressors:  Social relationships: threatening roommates  Living/Environment/Situation:  Living Arrangements: Group Home  Family History:     Childhood History:  Was the patient ever a victim of a crime or a disaster?: No Witnessed domestic violence?: No Has patient been effected by domestic violence as an adult?: No  Education:  Currently a Consulting civil engineer?: No  Employment/Work Situation:   Employment situation: On disability Has patient ever been in the [employer]?: No Has patient ever served in Buyer, retail?: No  Financial Resources:   Financial resources: Insurance claims handler Does patient have a Lawyer or guardian?: Yes Name of representative payee or guardian: brother no longer wants to be guardian and group home is more involved in patient care  Alcohol/Substance Abuse:      Social Support System:   Patient's Community Support System: Fair  Leisure/Recreation:      Strengths/Needs:      Discharge Plan:   Does patient have access to transportation?: Yes Will patient be returning to same living situation after discharge?: Yes Currently receiving community mental health services: Yes (From [facility]) (in [place] but is referred to [facility] in [place]) Does patient have financial barriers related to discharge medications?: No  Summary/Recommendations:  Pateint is a single 49 yo wm admitted for making threats to another resident in his group home [facility]. Patient will stabilize on meds and discharge back to group home. Patient has been seen in [place] for mental health and is referred to [place] in [place]. Paitent is encouraged to particiapte in med mngt, group therapy, and therpeutic milieu.     Lulu Riding., MSW, Theresia Majors  10/21/2014

## 2014-10-21 NOTE — Discharge Summary (Signed)
Physician Discharge Summary Note  Patient:  Jaime Vargas is an 49 y.o., male MRN:  [id number] DOB:  March 26, 1965 Patient phone:  [phone] (home)  Patient address:   [address] [place] [address],  Total Time spent with patient: 30 minutes  Date of Admission:  10/19/2014 Date of Discharge: 10/21/2014  Reason for Admission:  Suicidal and homicidal threats.  Identifying data. Jaime Vargas is a 49 year old male with a history of schizoaffective disorder.  Chief complaint. "The buy [place] snacks once a month."  History of present illness. A history of was obtained from the chart and the patient who is a poor historian. He was brought to the emergency room from his assisted living facility after making threats to kill everybody. Apparently he did not like his roommate was hygiene is poor. Today he complained to me that he is not and that snacks are available no more than once a month. He no longer feels upset or homicidal but received as needed Haldol Ativan) in the emergency room due to agitation. He denies any symptoms of depression, anxiety, or psychosis. He denies having traumatic brain injury even though on admission and he was talking about head injury he sustained at the age of 23. He denies any substance use but is positive for methadone on admission as well as Tri-Cyclics.  Past psychiatric history. At the patient reports one prior hospitalization in [place]. He cannot name his medications but believes that the current regimen is working for him. He denies ever attempting suicide.  Family psychiatric history. Nonreported.  This social history. He is disabled from mental illness he lives in assisted living facility.   Principal Problem: Schizoaffective disorder, bipolar type Discharge Diagnoses: Patient Active Problem List   Diagnosis Date Noted  . Tobacco use disorder [Z72.0] 10/20/2014  . Schizoaffective disorder, bipolar type [F25.0] 10/19/2014  . Traumatic brain  injury [S06.9X0A] 10/19/2014    Musculoskeletal: Strength & Muscle Tone: within normal limits Gait & Station: normal Patient leans: N/A  Psychiatric Specialty Exam: Physical Exam  Nursing note and vitals reviewed.   Review of Systems  All other systems reviewed and are negative.   Blood pressure 104/76, pulse 108, temperature 97.8 F (36.6 C), temperature source Oral, resp. rate 20, height  (1.753 m), weight 66.225 kg (146 lb), SpO2 99 %.Body mass index is 21.55 kg/(m^2).  See SRA.                                                  Sleep:  Number of Hours: 5      Has this patient used any form of tobacco in the last 30 days? (Cigarettes, Smokeless Tobacco, Cigars, and/or Pipes) Yes, A prescription for an FDA-approved tobacco cessation medication was offered at discharge and the patient refused  Past Medical History:  Past Medical History  Diagnosis Date  . Schizoaffective disorder   . Traumatic brain injury   . Heart murmur   . Headache    History reviewed. No pertinent past surgical history. Family History:  Family History  Problem Relation Age of Onset  . Schizophrenia Father    Social History:  History  Alcohol Use No     History  Drug Use No    Comment: Patient reports past use    Social History   Social History  . Marital Status: Divorced  Spouse Name: N/A  . Number of Children: N/A  . Years of Education: N/A   Social History Main Topics  . Smoking status: Current Every Day Smoker -- 2.00 packs/day for 39 years    Types: Cigarettes  . Smokeless tobacco: Never Used  . Alcohol Use: No  . Drug Use: No     Comment: Patient reports past use  . Sexual Activity: Yes    Birth Control/ Protection: None   Other Topics Concern  . None   Social History Narrative    Past Psychiatric History: Hospitalizations:  Outpatient Care:  Substance Abuse Care:  Self-Mutilation:  Suicidal Attempts:  Violent Behaviors:   Risk to  Self: Is patient at risk for suicide?: No Risk to Others:   Prior Inpatient Therapy:   Prior Outpatient Therapy:    Level of Care:  OP  Hospital Course:    Mr. Borba is a 49 year old male with a history of schizoaffective disorder admitted for making homicidal threats and agitated behavior at the group home.  1. Homicidal threats. This has resolved. The patient no longer feels suicidal or homicidal.  He is able to contract for safety.  2. Mood and psychosis. We continued Trileptal for mood stabilization, Remeron for depression and Invega for psychosis. We discontinued Lamictal and substitute it with Depakote as his group home owner reports that the patient did well on Depakote in the past.   3. Smoking. He smokes 2 packs a day, nicotine patch is available.  4. Disposition. The patient was discharged back to the [facility]. He will follow up with a local provider.   Consults:  None  Significant Diagnostic Studies:  None  Discharge Vitals:   Blood pressure 104/76, pulse 108, temperature 97.8 F (36.6 C), temperature source Oral, resp. rate 20, height  (1.753 m), weight 66.225 kg (146 lb), SpO2 99 %. Body mass index is 21.55 kg/(m^2). Lab Results:   Results for orders placed or performed during the hospital encounter of 10/19/14 (from the past 72 hour(s))  Lipid panel     Status: Abnormal   Collection Time: 10/21/14  7:07 AM  Result Value Ref Range   Cholesterol 153 0 - 200 mg/dL   Triglycerides 77 <161 mg/dL   HDL 34 (L) >09 mg/dL   Total CHOL/HDL Ratio 4.5 RATIO   VLDL 15 0 - 40 mg/dL   LDL Cholesterol 604 (H) 0 - 99 mg/dL    Comment:        Total Cholesterol/HDL:CHD Risk Coronary Heart Disease Risk Table                     Men   Women  1/2 Average Risk   3.4   3.3  Average Risk       5.0   4.4  2 X Average Risk   9.6   7.1  3 X Average Risk  23.4   11.0        Use the calculated Patient Ratio above and the CHD Risk Table to determine the  patient's CHD Risk.        ATP III CLASSIFICATION (LDL):  <100     mg/dL   Optimal  540-981  mg/dL   Near or Above                    Optimal  130-159  mg/dL   Borderline  191-478  mg/dL   High  >295     mg/dL  Very High   TSH     Status: None   Collection Time: 10/21/14  7:07 AM  Result Value Ref Range   TSH 1.817 0.350 - 4.500 uIU/mL    Physical Findings: AIMS: Facial and Oral Movements Muscles of Facial Expression: None, normal Lips and Perioral Area: None, normal Jaw: None, normal Tongue: None, normal,Extremity Movements Upper (arms, wrists, hands, fingers): None, normal Lower (legs, knees, ankles, toes): None, normal, Trunk Movements Neck, shoulders, hips: None, normal, Overall Severity Severity of abnormal movements (highest score from questions above): None, normal Incapacitation due to abnormal movements: None, normal Patient's awareness of abnormal movements (rate only patient's report): No Awareness, Dental Status Current problems with teeth and/or dentures?: No Does patient usually wear dentures?: Yes  CIWA:  CIWA-Ar Total: 0 COWS:      See Psychiatric Specialty Exam and Suicide Risk Assessment completed by Attending Physician prior to discharge.  Discharge destination:  Home  Is patient on multiple antipsychotic therapies at discharge:  No   Has Patient had three or more failed trials of antipsychotic monotherapy by history:  No    Recommended Plan for Multiple Antipsychotic Therapies: NA  Discharge Instructions    Diet - low sodium heart healthy    Complete by:  As directed      Increase activity slowly    Complete by:  As directed             Medication List    STOP taking these medications        lamoTRIgine 100 MG tablet  Commonly known as:  LAMICTAL      TAKE these medications      Indication   divalproex 500 MG DR tablet  Commonly known as:  DEPAKOTE  Take 1 tablet (500 mg total) by mouth every 12 (twelve) hours.   Indication:   Schizophrenia     hydrOXYzine 50 MG tablet  Commonly known as:  ATARAX/VISTARIL  Take 1 tablet (50 mg total) by mouth 3 (three) times daily as needed.   Indication:  Anxiety Neurosis     mirtazapine 30 MG tablet  Commonly known as:  REMERON  Take 1 tablet (30 mg total) by mouth at bedtime.   Indication:  Major Depressive Disorder     oxcarbazepine 600 MG tablet  Commonly known as:  TRILEPTAL  Take 1 tablet (600 mg total) by mouth 2 (two) times daily.   Indication:  Manic-Depression     paliperidone 6 MG 24 hr tablet  Commonly known as:  INVEGA  Take 1 tablet (6 mg total) by mouth daily.   Indication:  Schizoaffective Disorder         Follow-up recommendations:  Activity:  As tolerated. Diet:  Low sodium, heart healthy. Other:  Keep follow-up appointments.  Comments:    Total Discharge Time: 35 min.  Signed: Jolanta Pucilowska 10/21/2014, 9:09 AM

## 2014-10-21 NOTE — BHH Group Notes (Signed)
[facility] LCSW Group Therapy  10/21/2014 12:00 PM  Type of Therapy:  Group Therapy  Participation Level:  Active  Participation Quality:  Appropriate and Attentive  Affect:  Appropriate  Cognitive:  Alert and Disorganized  Insight:  Improving  Engagement in Therapy:  Improving  Modes of Intervention:  Socialization and Support  Summary of Progress/Problems: Patient attended group and participated in group but was disorganized. Patient shared that he likes playing cards and that he will go back to his group home.   Lulu Riding, MSW, LCSWA 10/21/2014, 12:00 PM

## 2014-10-21 NOTE — BHH Suicide Risk Assessment (Signed)
[facility] Discharge Suicide Risk Assessment   Demographic Factors:  Male and Caucasian  Total Time spent with patient: 30 minutes  Musculoskeletal: Strength & Muscle Tone: within normal limits Gait & Station: normal Patient leans: N/A  Psychiatric Specialty Exam: Physical Exam  Nursing note and vitals reviewed.   Review of Systems  All other systems reviewed and are negative.   Blood pressure 104/76, pulse 108, temperature 97.8 F (36.6 C), temperature source Oral, resp. rate 20, height  (1.753 m), weight 66.225 kg (146 lb), SpO2 99 %.Body mass index is 21.55 kg/(m^2).  General Appearance: Casual  Eye Contact::  Good  Speech:  Slurred409  Volume:  Normal  Mood:  Euthymic  Affect:  Appropriate  Thought Process:  Goal Directed  Orientation:  Full (Time, Place, and Person)  Thought Content:  WDL  Suicidal Thoughts:  No  Homicidal Thoughts:  No  Memory:  Immediate;   Fair Recent;   Fair Remote;   Fair  Judgement:  Fair  Insight:  Fair  Psychomotor Activity:  Normal  Concentration:  Fair  Recall:  [employer] of Knowledge:Fair  Language: Fair  Akathisia:  No  Handed:  Right  AIMS (if indicated):     Assets:  Communication Skills Desire for Improvement Financial Resources/Insurance Housing Physical Health Resilience  Sleep:  Number of Hours: 5  Cognition: WNL  ADL's:  Intact      Has this patient used any form of tobacco in the last 30 days? (Cigarettes, Smokeless Tobacco, Cigars, and/or Pipes) Yes, A prescription for an FDA-approved tobacco cessation medication was offered at discharge and the patient refused  Mental Status Per Nursing Assessment::   On Admission:     Current Mental Status by Physician: NA  Loss Factors: NA  Historical Factors: Impulsivity  Risk Reduction Factors:   Living with another person, especially a relative and Positive social support  Continued Clinical Symptoms:  Schizophrenia:   Depressive state  Cognitive Features That  Contribute To Risk:  None    Suicide Risk:  Minimal: No identifiable suicidal ideation.  Patients presenting with no risk factors but with morbid ruminations; may be classified as minimal risk based on the severity of the depressive symptoms  Principal Problem: Schizoaffective disorder, bipolar type Discharge Diagnoses:  Patient Active Problem List   Diagnosis Date Noted  . Tobacco use disorder [Z72.0] 10/20/2014  . Schizoaffective disorder, bipolar type [F25.0] 10/19/2014  . Traumatic brain injury [S06.9X0A] 10/19/2014      Plan Of Care/Follow-up recommendations:  Activity:  As tolerated. Diet:  Low sodium heart healthy. Other:  keep follow-up appointments.  Is patient on multiple antipsychotic therapies at discharge:  No   Has Patient had three or more failed trials of antipsychotic monotherapy by history:  No  Recommended Plan for Multiple Antipsychotic Therapies: NA    Jolanta Pucilowska 10/21/2014, 9:06 AM

## 2014-10-21 NOTE — Plan of Care (Signed)
Problem: Ineffective individual coping Goal: STG: Patient will remain free from self harm Outcome: Progressing Medications (Depakote 500 mg, Remeron 30 mg and Trileptal 600 mg) administered as ordered by the physician, no PRN given, 15 minute checks maintained for safety, clinical and moral support provided, patient encouraged to continue to express feelings and demonstrate safe care. Patient remains free from harm and injury, will continue to monitor.

## 2014-10-21 NOTE — BHH Group Notes (Signed)
[facility] LCSW Group Therapy  10/21/2014 4:21 PM  Type of Therapy:  Group Therapy  Participation Level:  Did Not Attend  Modes of Intervention:  Discussion, Education, Socialization and Support  Summary of Progress/Problems: Pt will identify unhealthy thoughts and how they impact their emotions and behavior. Pt will be encouraged to discuss these thoughts, emotions and behaviors with the group.   Candace L Hyatt MSW, LCSWA  10/21/2014, 4:21 PM  

## 2014-10-21 NOTE — Progress Notes (Signed)
  [facility] Adult Case Management Discharge Plan :  Will you be returning to the same living situation after discharge:  Yes,  back to [facility] [phone] At discharge, do you have transportation home?: Yes,  group home will pick up Do you have the ability to pay for your medications: Yes,  patient has Medicare  Release of information consent forms completed and in the chart;  Patient's signature needed at discharge.  Patient to Follow up at: Follow-up Information    Follow up with [facility]. Go on 10/23/2014.   Why:  For follow-up care appt Friday 10/23/14 at 9:00am   Contact information:   [address] [place], [place] Ph [phone] Fax [phone]      Patient denies SI/HI: Yes,  patient denies SI/HI    Safety Planning and Suicide Prevention discussed: Yes,  SPE discussed with patient and his group home     Has patient been referred to the Quitline?: N/A patient is not a smoker  Lulu Riding, MSW, LCSWA 10/21/2014, 11:58 AM

## 2014-10-21 NOTE — Tx Team (Signed)
Interdisciplinary Treatment Plan Update (Adult)  Date:  10/21/2014 Time Reviewed:  8:54 AM  Progress in Treatment: Attending groups: Yes. Participating in groups:  No. Taking medication as prescribed:  Yes. Tolerating medication:  Yes. Family/Significant othe contact made:  Yes, individual(s) contacted:  patient's group home [facility] [phone] Patient understands diagnosis:  Yes. Discussing patient identified problems/goals with staff:  Yes. Medical problems stabilized or resolved:  Yes. Denies suicidal/homicidal ideation: Yes. Issues/concerns per patient self-inventory:  No. Other:  New problem(s) identified: No, Describe:  none reported  Discharge Plan or Barriers:  Patient will discharge back to group home and will be referred to [facility] agency  Reason for Continuation of Hospitalization: Homicidal ideation  Comments:  Estimated length of stay: 1 day with expected dishcarge 10/21/14  New goal(s):  Review of initial/current patient goals per problem list:   1.  Goal(s): Patient will no longer voice HI  Target date: by discharge    Attendees: Physician:  Kristine Linea, MD 9/21/20168:54 AM  Nursing:   Nile Riggs, RN 9/21/20168:54 AM  Other:  Beryl Meager, LCSWA 9/21/20168:54 AM   Scribe for Treatment Team:   Lulu Riding, MSW, LCSWA  10/21/2014, 8:54 AM

## 2014-10-21 NOTE — Progress Notes (Signed)
D/C note   D: pt aware of discharge this shift, pt denies suicidal ideation or homicidal ideation, pt calm and cooperative, no distress noted  A: all personal items in locker returned to patient, instructions given on discharge information, received prescriptions and follow up appointment  R: patient states he will comply with outpatient services and medications as prescribed.  Patient left with group home.

## 2014-10-29 ENCOUNTER — Emergency Department
Admission: EM | Admit: 2014-10-29 | Discharge: 2014-10-30 | Disposition: A | Discharge status: Other Acute Inpatient Hospital | Payer: Medicare Other | Attending: Emergency Medicine | Admitting: Emergency Medicine

## 2014-10-29 ENCOUNTER — Encounter: Payer: Self-pay | Admitting: Emergency Medicine

## 2014-10-29 DIAGNOSIS — F250 Schizoaffective disorder, bipolar type: Secondary | ICD-10-CM | POA: Diagnosis present

## 2014-10-29 DIAGNOSIS — S069XAA Unspecified intracranial injury with loss of consciousness status unknown, initial encounter: Secondary | ICD-10-CM | POA: Diagnosis present

## 2014-10-29 DIAGNOSIS — Z79899 Other long term (current) drug therapy: Secondary | ICD-10-CM | POA: Insufficient documentation

## 2014-10-29 DIAGNOSIS — Z720 Tobacco use: Secondary | ICD-10-CM | POA: Insufficient documentation

## 2014-10-29 DIAGNOSIS — R451 Restlessness and agitation: Secondary | ICD-10-CM | POA: Diagnosis not present

## 2014-10-29 DIAGNOSIS — R45850 Homicidal ideations: Secondary | ICD-10-CM

## 2014-10-29 DIAGNOSIS — F17200 Nicotine dependence, unspecified, uncomplicated: Secondary | ICD-10-CM | POA: Diagnosis present

## 2014-10-29 DIAGNOSIS — S069X0S Unspecified intracranial injury without loss of consciousness, sequela: Secondary | ICD-10-CM | POA: Diagnosis not present

## 2014-10-29 DIAGNOSIS — S069X9A Unspecified intracranial injury with loss of consciousness of unspecified duration, initial encounter: Secondary | ICD-10-CM | POA: Diagnosis present

## 2014-10-29 LAB — CBC
HEMATOCRIT: 41.5 % (ref 40.0–52.0)
HEMOGLOBIN: 14.1 g/dL (ref 13.0–18.0)
MCH: 30.7 pg (ref 26.0–34.0)
MCHC: 33.9 g/dL (ref 32.0–36.0)
MCV: 90.3 fL (ref 80.0–100.0)
Platelets: 176 10*3/uL (ref 150–440)
RBC: 4.6 MIL/uL (ref 4.40–5.90)
RDW: 13.5 % (ref 11.5–14.5)
WBC: 6.4 10*3/uL (ref 3.8–10.6)

## 2014-10-29 LAB — URINE DRUG SCREEN, QUALITATIVE (ARMC ONLY)
AMPHETAMINES, UR SCREEN: NOT DETECTED
BENZODIAZEPINE, UR SCRN: NOT DETECTED
Barbiturates, Ur Screen: NOT DETECTED
Cannabinoid 50 Ng, Ur ~~LOC~~: NOT DETECTED
Cocaine Metabolite,Ur ~~LOC~~: NOT DETECTED
MDMA (Ecstasy)Ur Screen: NOT DETECTED
METHADONE SCREEN, URINE: NOT DETECTED
OPIATE, UR SCREEN: NOT DETECTED
Phencyclidine (PCP) Ur S: NOT DETECTED
TRICYCLIC, UR SCREEN: NOT DETECTED

## 2014-10-29 LAB — COMPREHENSIVE METABOLIC PANEL
ALBUMIN: 4.2 g/dL (ref 3.5–5.0)
ALT: 10 U/L — ABNORMAL LOW (ref 17–63)
ANION GAP: 8 (ref 5–15)
AST: 17 U/L (ref 15–41)
Alkaline Phosphatase: 68 U/L (ref 38–126)
BUN: 14 mg/dL (ref 6–20)
CO2: 25 mmol/L (ref 22–32)
Calcium: 9.5 mg/dL (ref 8.9–10.3)
Chloride: 109 mmol/L (ref 101–111)
Creatinine, Ser: 0.83 mg/dL (ref 0.61–1.24)
GFR calc non Af Amer: >60 mL/min (ref >60)
GLUCOSE: 100 mg/dL — AB (ref 65–99)
POTASSIUM: 3.9 mmol/L (ref 3.5–5.1)
SODIUM: 142 mmol/L (ref 135–145)
TOTAL PROTEIN: 6.9 g/dL (ref 6.5–8.1)
Total Bilirubin: 0.3 mg/dL (ref 0.3–1.2)

## 2014-10-29 LAB — ETHANOL: Alcohol, Ethyl (B): <5 mg/dL (ref <5)

## 2014-10-29 LAB — ACETAMINOPHEN LEVEL

## 2014-10-29 LAB — SALICYLATE LEVEL

## 2014-10-29 MED ORDER — TRAZODONE HCL 100 MG PO TABS
100.0000 mg | ORAL_TABLET | Freq: Every day | ORAL | Status: DC
  Administered 2014-10-29 – 2014-10-30 (×2): 100 mg via ORAL
  Filled 2014-10-29: qty 1

## 2014-10-29 MED ORDER — MIRTAZAPINE 15 MG PO TABS
30.0000 mg | ORAL_TABLET | Freq: Every day | ORAL | Status: DC
Start: 2014-10-29 — End: 2014-10-31
  Administered 2014-10-29 – 2014-10-30 (×2): 30 mg via ORAL
  Filled 2014-10-29: qty 1
  Filled 2014-10-29: qty 2

## 2014-10-29 MED ORDER — OXCARBAZEPINE 300 MG PO TABS
600.0000 mg | ORAL_TABLET | Freq: Two times a day (BID) | ORAL | Status: DC
  Administered 2014-10-29 – 2014-10-30 (×3): 600 mg via ORAL
  Filled 2014-10-29 (×3): qty 2

## 2014-10-29 MED ORDER — TRAZODONE HCL 100 MG PO TABS
ORAL_TABLET | ORAL | Status: AC
  Filled 2014-10-29: qty 1

## 2014-10-29 MED ORDER — DIVALPROEX SODIUM 500 MG PO DR TAB
500.0000 mg | DELAYED_RELEASE_TABLET | Freq: Two times a day (BID) | ORAL | Status: DC
  Administered 2014-10-29 – 2014-10-30 (×3): 500 mg via ORAL
  Filled 2014-10-29 (×3): qty 1

## 2014-10-29 MED ORDER — HYDROXYZINE HCL 25 MG PO TABS
50.0000 mg | ORAL_TABLET | Freq: Three times a day (TID) | ORAL | Status: DC | PRN
  Administered 2014-10-30: 50 mg via ORAL
  Filled 2014-10-29: qty 2

## 2014-10-29 MED ORDER — LEVOTHYROXINE SODIUM 125 MCG PO TABS
125.0000 ug | ORAL_TABLET | Freq: Every day | ORAL | Status: DC
  Administered 2014-10-30: 125 ug via ORAL
  Filled 2014-10-29 (×2): qty 1

## 2014-10-29 MED ORDER — NICOTINE 10 MG IN INHA
1.0000 | RESPIRATORY_TRACT | Status: DC | PRN
  Administered 2014-10-29: 1 via RESPIRATORY_TRACT
  Filled 2014-10-29: qty 36

## 2014-10-29 NOTE — ED Notes (Signed)
Pt brought to ed by caregiver of [facility] with threatening to kill everybody in the group home today and states that if he goes back there he will kill everyone there. No thoughts of SI.

## 2014-10-29 NOTE — ED Notes (Signed)
Pt. To [facility] from ED ambulatory without difficulty, to room B-4. Report from [facility]. Pt. Is alert and oriented, skin is warm and dry;  in no distress. Pt. denies SI; verbalizing HI, and AH. Pt. Calm and cooperative. Pt. Made aware of security cameras and Q15 minute rounds. Pt. Encouraged to let Nursing staff know of any concerns or needs.

## 2014-10-29 NOTE — ED Provider Notes (Signed)
[facility] Emergency Department Vidal Lampkins Note  Time seen: 7:28 PM  I have reviewed the triage vital signs and the nursing notes.   HISTORY  Chief Complaint Homicidal    HPI Jaime Vargas is a 49 y.o. male with a past medical history of schizophrenia, traumatic brain injury, headaches, presents the emergency department with homicidal ideation. Patient lives at a group home. He states he is having problems with staff and other residents, and tonight got very upset and states he will kill everyone in the group home. He states if we send him home he will burn the group home down killing everyone inside.. Denies any suicidal ideation. Denies any medical complaints tonight.     Past Medical History  Diagnosis Date  . Schizoaffective disorder   . Traumatic brain injury   . Heart murmur   . Headache     Patient Active Problem List   Diagnosis Date Noted  . Tobacco use disorder 10/20/2014  . Schizoaffective disorder, bipolar type 10/19/2014  . Traumatic brain injury 10/19/2014    History reviewed. No pertinent past surgical history.  Current Outpatient Rx  Name  Route  Sig  Dispense  Refill  . divalproex (DEPAKOTE) 500 MG DR tablet   Oral   Take 1 tablet (500 mg total) by mouth every 12 (twelve) hours.   60 tablet   0   . hydrOXYzine (ATARAX/VISTARIL) 50 MG tablet   Oral   Take 1 tablet (50 mg total) by mouth 3 (three) times daily as needed.   90 tablet   0   . mirtazapine (REMERON) 30 MG tablet   Oral   Take 1 tablet (30 mg total) by mouth at bedtime.   30 tablet   0   . oxcarbazepine (TRILEPTAL) 600 MG tablet   Oral   Take 1 tablet (600 mg total) by mouth 2 (two) times daily.   60 tablet   0   . paliperidone (INVEGA) 6 MG 24 hr tablet   Oral   Take 1 tablet (6 mg total) by mouth daily.   30 tablet   0     Allergies Review of patient's allergies indicates no known allergies.  Family History  Problem Relation Age of Onset  .  Schizophrenia Father     Social History Social History  Substance Use Topics  . Smoking status: Current Every Day Smoker -- 2.00 packs/day for 39 years    Types: Cigarettes  . Smokeless tobacco: Never Used  . Alcohol Use: No    Review of Systems Constitutional: Negative for fever. Cardiovascular: Negative for chest pain. Respiratory: Negative for shortness of breath. Gastrointestinal: Negative for abdominal pain Neurological: Negative for headaches, focal weakness or numbness. Psychiatric:Continue homicidal ideation. Denies suicidal ideation. 10-point ROS otherwise negative.  ____________________________________________   PHYSICAL EXAM:  VITAL SIGNS: ED Triage Vitals  Enc Vitals Group     BP 10/29/14 1843 121/81 mmHg     Pulse Rate 10/29/14 1843 84     Resp 10/29/14 1843 20     Temp 10/29/14 1843 98.2 F (36.8 C)     Temp Source 10/29/14 1843 Oral     SpO2 10/29/14 1843 96 %     Weight 10/29/14 1838 155 lb (70.308 kg)     Height 10/29/14 1838  (1.753 m)     Head Cir --      Peak Flow --      Pain Score --      Pain Loc --  Pain Edu? --      Excl. in GC? --     Constitutional: Alert and oriented. Well appearing and in no distress. Eyes: Normal exam ENT   Head: Normocephalic and atraumatic. Cardiovascular: Normal rate, regular rhythm.  Respiratory: Normal respiratory effort without tachypnea nor retractions. Mild wheeze bilaterally. No rales or rhonchi. Gastrointestinal: Soft and nontender. No distention.   Musculoskeletal: Nontender with normal range of motion in all extremities.  Neurologic:  Normal speech and language. No gross focal neurologic deficits are appreciated. Speech is normal. Skin:  Skin is warm, dry and intact.  Psychiatric: Mood and affect are normal. Speech and behavior are normal. Patient exhibits appropriate insight and judgment.  ____________________________________________    INITIAL IMPRESSION / ASSESSMENT AND PLAN / ED  COURSE  Pertinent labs & imaging results that were available during my care of the patient were reviewed by me and considered in my medical decision making (see chart for details).  Patient present emergency department homicidal ideation. States he is having trouble with residents and staff at his group home. States he threatened to kill everyone there tonight. States if we send him back he will set the house on fire killing everybody. Denies any medical complaints tonight. We will check labs, placed the patient on an involuntary commitment, and closely monitor him until psychiatry can evaluate the patient.  Basic labs are largely within normal limits. Currently awaiting psychiatric evaluation.  ____________________________________________   FINAL CLINICAL IMPRESSION(S) / ED DIAGNOSES  Homicidal ideation   Minna Antis, MD 10/29/14 2005

## 2014-10-29 NOTE — BH Assessment (Signed)
Assessment Note  Jaime Vargas is an 49 y.o. male presenting to the ED with homicidal ideations his group home. Pt states he is having problems with staff and other residents, and tonight got very upset and states he will kill everyone in the group home. He states if we send him home he will burn the group home down killing everyone inside.. Denies any suicidal ideation, A/V hallucinations.   Diagnosis: Schizoaffective disorder  Past Medical History:  Past Medical History  Diagnosis Date  . Schizoaffective disorder   . Traumatic brain injury   . Heart murmur   . Headache     History reviewed. No pertinent past surgical history.  Family History:  Family History  Problem Relation Age of Onset  . Schizophrenia Father     Social History:  reports that he has been smoking Cigarettes.  He has a 78 pack-year smoking history. He has never used smokeless tobacco. He reports that he does not drink alcohol or use illicit drugs.  Additional Social History:  Alcohol / Drug Use History of alcohol / drug use?: No history of alcohol / drug abuse  CIWA: CIWA-Ar BP: 121/81 mmHg Pulse Rate: 84 COWS:    Allergies: No Known Allergies  Home Medications:  (Not in a hospital admission)  OB/GYN Status:  No LMP for male patient.  General Assessment Data Location of Assessment: [facility] ED TTS Assessment: In system Is this a Tele or Face-to-Face Assessment?: Face-to-Face Is this an Initial Assessment or a Re-assessment for this encounter?: Initial Assessment Marital status: Single Maiden name: N/A Is patient pregnant?: No Pregnancy Status: No Living Arrangements: Group Home ([facility]) Can pt return to current living arrangement?: Yes Admission Status: Voluntary Is patient capable of signing voluntary admission?: No Referral Source: Self/Family/Friend Insurance type: Medicare  Medical Screening Exam [facility] Walk-in ONLY) Medical Exam completed: Yes  Crisis Care Plan Living  Arrangements: Group Home ([facility]) Name of Psychiatrist: N/A Name of Therapist: N/A  Education Status Is patient currently in school?: No Current Grade: N/A Highest grade of school patient has completed: 12th Name of school: Investment banker, operational person: N/A  Risk to self with the past 6 months Suicidal Ideation: No Has patient been a risk to self within the past 6 months prior to admission? : No Suicidal Intent: No Has patient had any suicidal intent within the past 6 months prior to admission? : No Is patient at risk for suicide?: No Suicidal Plan?: No Has patient had any suicidal plan within the past 6 months prior to admission? : No Access to Means: No What has been your use of drugs/alcohol within the last 12 months?: None reported Previous Attempts/Gestures: No How many times?: 0 Other Self Harm Risks: None reported Triggers for Past Attempts: None known Intentional Self Injurious Behavior: None Family Suicide History: Unknown Recent stressful life event(s): Conflict (Comment) (Conflict with group home residents) Persecutory voices/beliefs?: No Depression: No Substance abuse history and/or treatment for substance abuse?: No Suicide prevention information given to non-admitted patients: Not applicable  Risk to Others within the past 6 months Homicidal Ideation: Yes-Currently Present Does patient have any lifetime risk of violence toward others beyond the six months prior to admission? : No Thoughts of Harm to Others: Yes-Currently Present Comment - Thoughts of Harm to Others: Pt has thoughts of burning down group home with everyone inside Current Homicidal Intent: Yes-Currently Present Current Homicidal Plan: Yes-Currently Present Describe Current Homicidal Plan: Pt planned to burn down group home with everyone inside  Access to Homicidal Means: No Identified Victim: group home staff and residents History of harm to others?: No Assessment of Violence: None  Noted Violent Behavior Description: None Does patient have access to weapons?: No Criminal Charges Pending?: No Does patient have a court date: No Is patient on probation?: No  Psychosis Hallucinations: None noted Delusions: None noted  Mental Status Report Appearance/Hygiene: Unremarkable, In scrubs Eye Contact: Good Motor Activity: Freedom of movement Speech: Logical/coherent Level of Consciousness: Alert Mood: Pleasant Affect: Appropriate to circumstance Anxiety Level: None Thought Processes: Coherent Judgement: Partial Orientation: Person, Place, Time, Situation Obsessive Compulsive Thoughts/Behaviors: Minimal  Cognitive Functioning Concentration: Normal Memory: Recent Intact IQ: Average Insight: Poor Impulse Control: Poor Appetite: Poor Weight Loss: 0 Weight Gain: 0 Sleep: No Change Total Hours of Sleep: 8 Vegetative Symptoms: None  ADLScreening [facility] Assessment Services) Patient's cognitive ability adequate to safely complete daily activities?: Yes Patient able to express need for assistance with ADLs?: Yes Independently performs ADLs?: Yes (appropriate for developmental age)  Prior Inpatient Therapy Prior Inpatient Therapy: No Prior Therapy Dates: N/A Prior Therapy Facilty/Provider(s): N/A Reason for Treatment: N/A  Prior Outpatient Therapy Prior Outpatient Therapy: No Prior Therapy Dates: N/A Prior Therapy Facilty/Provider(s): N/A Reason for Treatment: N/A Does patient have an ACCT team?: No Does patient have Intensive In-House Services?  : No Does patient have [facility] services? : No Does patient have P4CC services?: No  ADL Screening (condition at time of admission) Patient's cognitive ability adequate to safely complete daily activities?: Yes Patient able to express need for assistance with ADLs?: Yes Independently performs ADLs?: Yes (appropriate for developmental age)       Abuse/Neglect Assessment (Assessment to be complete while patient  is alone) Physical Abuse: Denies Verbal Abuse: Denies Sexual Abuse: Denies Exploitation of patient/patient's resources: Denies Self-Neglect: Denies Values / Beliefs Cultural Requests During Hospitalization: None Spiritual Requests During Hospitalization: None Consults Spiritual Care Consult Needed: No Social Work Consult Needed: No      Additional Information 1:1 In Past 12 Months?: No CIRT Risk: No Elopement Risk: No Does patient have medical clearance?: Yes     Disposition:  Disposition Initial Assessment Completed for this Encounter: Yes Disposition of Patient: Other dispositions Other disposition(s): Other (Comment) (Psych MD consult)  On Site Evaluation by:   Reviewed with Physician:    [place] 10/29/2014 9:05 PM

## 2014-10-30 DIAGNOSIS — R451 Restlessness and agitation: Secondary | ICD-10-CM | POA: Diagnosis not present

## 2014-10-30 DIAGNOSIS — S069X0S Unspecified intracranial injury without loss of consciousness, sequela: Secondary | ICD-10-CM | POA: Diagnosis not present

## 2014-10-30 MED ORDER — LEVOFLOXACIN 500 MG PO TABS
ORAL_TABLET | ORAL | Status: AC
  Filled 2014-10-30: qty 1

## 2014-10-30 NOTE — BHH Counselor (Signed)
Received phone call from Group Home Staff ([phone]) stating, the administrator told her, she told someone at [facility] the patient wasn't able to return to the Group Home. She was unable to provide the name of the [facility] staff member this was told to. Writer asked what took place for him, for him to be unable to return, staff reported he threatened to kill staff and the residents and burn the house down. Writer explained, it is their choice not to come and get the patient but it will be considered an unsafe and improper discharge. He didn't touch anyone, physically harm anyone, nor was he giving a 30 day notice, he wasn't provided assistance by the Group Home for other placement, he wasn't admitted to the hospital but remained in the ER. If the patient isn't picked up by the Group Home, [facility] will submit a formal complaint to the state, DSS, [employer] for [employer]. Group Home staff stated she will call her administrator back and someone will come and pick him up.  Writer updated the patient's RN (Amy T. & Margaret).

## 2014-10-30 NOTE — ED Notes (Signed)
BEHAVIORAL HEALTH ROUNDING Patient sleeping: No. Patient alert and oriented: yes Behavior appropriate: Yes.  ; If no, describe:  Nutrition and fluids offered: yes Toileting and hygiene offered: Yes  Sitter present: q15 minute observations and security camera monitoring Law enforcement present: Yes  [facility]  

## 2014-10-30 NOTE — ED Provider Notes (Signed)
-----------------------------------------   6:44 AM on 10/30/2014 -----------------------------------------   Blood pressure 121/81, pulse 84, temperature 98.2 F (36.8 C), temperature source Oral, resp. rate 20, height  (1.753 m), weight 155 lb (70.308 kg), SpO2 96 %.  The patient had no acute events since last update.  Calm and cooperative at this time.  Disposition is pending per Psychiatry/Behavioral Medicine team recommendations.     Irean Hong, MD 10/30/14 [phone]

## 2014-10-30 NOTE — BHH Counselor (Signed)
Writer called Group Home, [facility] ([phone]). She states she will need to call the Group Home Owner to get someone to get him. Staff states, someone will be able to pick him up, within the next hour, due to coming from [place], [place].

## 2014-10-30 NOTE — ED Notes (Signed)
Pt. Noted in room resting quietly;. No complaints or concerns voiced. No distress or abnormal behavior noted. Will continue to monitor with security cameras. Q 15 minute rounds continue. 

## 2014-10-30 NOTE — Consult Note (Signed)
[facility] Consult   Reason for Consult:  Consult note for this 49 year old male with a history of traumatic brain injury and schizoaffective disorder who was sent from his group home because he made threats to kill everyone or burn the building down. Referring Physician:  Cinda Quest Patient Identification: Jaime Vargas MRN:  [id number] Principal Diagnosis: Traumatic brain injury Diagnosis:   Patient Active Problem List   Diagnosis Date Noted  . Tobacco use disorder [Z72.0] 10/20/2014  . Schizoaffective disorder, bipolar type [F25.0] 10/19/2014  . Traumatic brain injury [S06.9X0A] 10/19/2014    Total Time spent with patient: 1 hour  Subjective:   Jaime Vargas is a 49 y.o. male patient admitted with "they buy cheap ass food and they don't even have cable."  HPI:  Information from the patient and the chart. Notes reviewed. Commitment paperwork reviewed. Labs reviewed. Patient interviewed. Patient was sent from his group home with a report that he had threatened to burn the building down. The patient says that he did make a statement like Denies that he actually had any intention or plan of acting on it. He says that he was angry at the people at the group home because they continued to serve substandard food and not provide cable TV. The patient believes that given the amount of money that they pay for their monthly room and board that they should be getting better services. He tells me that his mood mostly is fine. Sleeps okay. Doesn't feel angry all the time. Denies that he's having any auditory or visual hallucinations. He says that he has been compliant with his outpatient medicine. Denies any other specific new stress. Not abusing drugs or alcohol.  Past psychiatric history: The patient was just discharged from our hospital on the 21st. He had been here in the hospital very briefly. He was exactly the same circumstance at that time except that it looked like he may have been  using some opiates. Patient was judged to not be acutely dangerous almost as soon as he came into the hospital. He tells me he has never actually tried to kill himself and denies any history of significant violence. Evidently he had a severe traumatic brain injury in the past which may be the root of most of his problems although he has also been diagnosed with schizoaffective disorder.  Medical history: Hypothyroid, history of brain injury.  Social history: Resides at a group home. Previously had [place] in another [place]. Doesn't have any family here in the immediate area but says that he stays in touch with some extended family elsewhere.  Substance abuse history: Denies that he's been drinking or abusing drugs recently. He minimizes or denies any long-standing alcohol or drug problem although he does smoke about 2 packs a day.    Past Psychiatric History: As noted above he has a history of schizoaffective disorder and brain injury. Emotionally labile but denies that he's been seriously violent no history of suicide attempts. Currently is being seen by an outpatient provider and maintained on his mood stabilizers  Risk to Self: Suicidal Ideation: No Suicidal Intent: No Is patient at risk for suicide?: No Suicidal Plan?: No Access to Means: No What has been your use of drugs/alcohol within the last 12 months?: None reported How many times?: 0 Other Self Harm Risks: None reported Triggers for Past Attempts: None known Intentional Self Injurious Behavior: None Risk to Others: Homicidal Ideation: Yes-Currently Present Thoughts of Harm to Others: Yes-Currently Present Comment - Thoughts of Harm  to Others: Pt has thoughts of burning down group home with everyone inside Current Homicidal Intent: Yes-Currently Present Current Homicidal Plan: Yes-Currently Present Describe Current Homicidal Plan: Pt planned to burn down group home with everyone inside Access to Homicidal Means: No Identified  Victim: group home staff and residents History of harm to others?: No Assessment of Violence: None Noted Violent Behavior Description: None Does patient have access to weapons?: No Criminal Charges Pending?: No Does patient have a court date: No Prior Inpatient Therapy: Prior Inpatient Therapy: No Prior Therapy Dates: N/A Prior Therapy Facilty/Provider(s): N/A Reason for Treatment: N/A Prior Outpatient Therapy: Prior Outpatient Therapy: No Prior Therapy Dates: N/A Prior Therapy Facilty/Provider(s): N/A Reason for Treatment: N/A Does patient have an ACCT team?: No Does patient have Intensive In-House Services?  : No Does patient have [facility] services? : No Does patient have P4CC services?: No  Past Medical History:  Past Medical History  Diagnosis Date  . Schizoaffective disorder   . Traumatic brain injury   . Heart murmur   . Headache    History reviewed. No pertinent past surgical history. Family History:  Family History  Problem Relation Age of Onset  . Schizophrenia Father    Family Psychiatric  History: Denies any family history of mental health problems that he knows of. No history of substance abuse problems. Social History:  History  Alcohol Use No     History  Drug Use No    Comment: Patient reports past use    Social History   Social History  . Marital Status: Divorced    Spouse Name: N/A  . Number of Children: N/A  . Years of Education: N/A   Social History Main Topics  . Smoking status: Current Every Day Smoker -- 2.00 packs/day for 39 years    Types: Cigarettes  . Smokeless tobacco: Never Used  . Alcohol Use: No  . Drug Use: No     Comment: Patient reports past use  . Sexual Activity: Yes    Birth Control/ Protection: None   Other Topics Concern  . None   Social History Narrative   Additional Social History:    History of alcohol / drug use?: No history of alcohol / drug abuse                     Allergies:  No Known  Allergies  Labs:  Results for orders placed or performed during the hospital encounter of 10/29/14 (from the past 48 hour(s))  Comprehensive metabolic panel     Status: Abnormal   Collection Time: 10/29/14  6:41 PM  Result Value Ref Range   Sodium 142 135 - 145 mmol/L   Potassium 3.9 3.5 - 5.1 mmol/L   Chloride 109 101 - 111 mmol/L   CO2 25 22 - 32 mmol/L   Glucose, Bld 100 (H) 65 - 99 mg/dL   BUN 14 6 - 20 mg/dL   Creatinine, Ser 0.83 0.61 - 1.24 mg/dL   Calcium 9.5 8.9 - 10.3 mg/dL   Total Protein 6.9 6.5 - 8.1 g/dL   Albumin 4.2 3.5 - 5.0 g/dL   AST 17 15 - 41 U/L   ALT 10 (L) 17 - 63 U/L   Alkaline Phosphatase 68 38 - 126 U/L   Total Bilirubin 0.3 0.3 - 1.2 mg/dL   GFR calc non Af Amer >60 >60 mL/min   GFR calc Af Amer >60 >60 mL/min    Comment: (NOTE) The eGFR has been calculated  using the CKD EPI equation. This calculation has not been validated in all clinical situations. eGFR's persistently <60 mL/min signify possible Chronic Kidney Disease.    Anion gap 8 5 - 15  Ethanol (ETOH)     Status: None   Collection Time: 10/29/14  6:41 PM  Result Value Ref Range   Alcohol, Ethyl (B) <5 <5 mg/dL    Comment:        LOWEST DETECTABLE LIMIT FOR SERUM ALCOHOL IS 5 mg/dL FOR MEDICAL PURPOSES ONLY   Salicylate level     Status: None   Collection Time: 10/29/14  6:41 PM  Result Value Ref Range   Salicylate Lvl <1.5 2.8 - 30.0 mg/dL  Acetaminophen level     Status: Abnormal   Collection Time: 10/29/14  6:41 PM  Result Value Ref Range   Acetaminophen (Tylenol), Serum <10 (L) 10 - 30 ug/mL    Comment:        THERAPEUTIC CONCENTRATIONS VARY SIGNIFICANTLY. A RANGE OF 10-30 ug/mL MAY BE AN EFFECTIVE CONCENTRATION FOR MANY PATIENTS. HOWEVER, SOME ARE BEST TREATED AT CONCENTRATIONS OUTSIDE THIS RANGE. ACETAMINOPHEN CONCENTRATIONS >150 ug/mL AT 4 HOURS AFTER INGESTION AND >50 ug/mL AT 12 HOURS AFTER INGESTION ARE OFTEN ASSOCIATED WITH TOXIC REACTIONS.   CBC     Status:  None   Collection Time: 10/29/14  6:41 PM  Result Value Ref Range   WBC 6.4 3.8 - 10.6 K/uL   RBC 4.60 4.40 - 5.90 MIL/uL   Hemoglobin 14.1 13.0 - 18.0 g/dL   HCT 41.5 40.0 - 52.0 %   MCV 90.3 80.0 - 100.0 fL   MCH 30.7 26.0 - 34.0 pg   MCHC 33.9 32.0 - 36.0 g/dL   RDW 13.5 11.5 - 14.5 %   Platelets 176 150 - 440 K/uL  Urine Drug Screen, Qualitative ([facility] only)     Status: None   Collection Time: 10/29/14  6:41 PM  Result Value Ref Range   Tricyclic, Ur Screen NONE DETECTED NONE DETECTED   Amphetamines, Ur Screen NONE DETECTED NONE DETECTED   MDMA (Ecstasy)Ur Screen NONE DETECTED NONE DETECTED   Cocaine Metabolite,Ur [place] NONE DETECTED NONE DETECTED   Opiate, Ur Screen NONE DETECTED NONE DETECTED   Phencyclidine (PCP) Ur S NONE DETECTED NONE DETECTED   Cannabinoid 50 Ng, Ur [place] NONE DETECTED NONE DETECTED   Barbiturates, Ur Screen NONE DETECTED NONE DETECTED   Benzodiazepine, Ur Scrn NONE DETECTED NONE DETECTED   Methadone Scn, Ur NONE DETECTED NONE DETECTED    Comment: (NOTE) 830  Tricyclics, urine               Cutoff 1000 ng/mL 200  Amphetamines, urine             Cutoff 1000 ng/mL 300  MDMA (Ecstasy), urine           Cutoff 500 ng/mL 400  Cocaine Metabolite, urine       Cutoff 300 ng/mL 500  Opiate, urine                   Cutoff 300 ng/mL 600  Phencyclidine (PCP), urine      Cutoff 25 ng/mL 700  Cannabinoid, urine              Cutoff 50 ng/mL 800  Barbiturates, urine             Cutoff 200 ng/mL 900  Benzodiazepine, urine           Cutoff 200 ng/mL 1000 Methadone,  urine                Cutoff 300 ng/mL 1100 1200 The urine drug screen provides only a preliminary, unconfirmed 1300 analytical test result and should not be used for non-medical 1400 purposes. Clinical consideration and professional judgment should 1500 be applied to any positive drug screen result due to possible 1600 interfering substances. A more specific alternate chemical method 1700 must be used in order to  obtain a confirmed analytical result.  1800 Gas chromato graphy / mass spectrometry (GC/MS) is the preferred 1900 confirmatory method.     Current Facility-Administered Medications  Medication Dose Route Frequency Provider Last Rate Last Dose  . divalproex (DEPAKOTE) DR tablet 500 mg  500 mg Oral Q12H Harvest Dark, MD   500 mg at 10/30/14 0945  . hydrOXYzine (ATARAX/VISTARIL) tablet 50 mg  50 mg Oral TID PRN Harvest Dark, MD   50 mg at 10/30/14 0945  . levothyroxine (SYNTHROID, LEVOTHROID) tablet 125 mcg  125 mcg Oral QAC breakfast Harvest Dark, MD   125 mcg at 10/30/14 [phone]  . mirtazapine (REMERON) tablet 30 mg  30 mg Oral QHS Harvest Dark, MD   30 mg at 10/29/14 2231  . nicotine (NICOTROL) 10 MG inhaler 1 continuous puffing  1 continuous puffing Inhalation PRN Harvest Dark, MD   1 continuous puffing at 10/29/14 2233  . Oxcarbazepine (TRILEPTAL) tablet 600 mg  600 mg Oral BID Harvest Dark, MD   600 mg at 10/30/14 0944  . traZODone (DESYREL) tablet 100 mg  100 mg Oral QHS Harvest Dark, MD   100 mg at 10/29/14 2159   Current Outpatient Prescriptions  Medication Sig Dispense Refill  . divalproex (DEPAKOTE) 500 MG DR tablet Take 1 tablet (500 mg total) by mouth every 12 (twelve) hours. 60 tablet 0  . hydrOXYzine (ATARAX/VISTARIL) 50 MG tablet Take 1 tablet (50 mg total) by mouth 3 (three) times daily as needed. 90 tablet 0  . levothyroxine (SYNTHROID, LEVOTHROID) 125 MCG tablet Take 125 mcg by mouth daily before breakfast.    . mirtazapine (REMERON) 30 MG tablet Take 1 tablet (30 mg total) by mouth at bedtime. 30 tablet 0  . oxcarbazepine (TRILEPTAL) 600 MG tablet Take 1 tablet (600 mg total) by mouth 2 (two) times daily. 60 tablet 0  . paliperidone (INVEGA) 6 MG 24 hr tablet Take 1 tablet (6 mg total) by mouth daily. 30 tablet 0    Musculoskeletal: Strength & Muscle Tone: within normal limits Gait & Station: normal Patient leans: N/A  Psychiatric  Specialty Exam: Review of Systems  Constitutional: Negative.   HENT: Negative.   Eyes: Negative.   Respiratory: Negative.   Cardiovascular: Negative.   Gastrointestinal: Negative.   Musculoskeletal: Negative.   Skin: Negative.   Neurological: Negative.   Psychiatric/Behavioral: Negative for depression, suicidal ideas, hallucinations, memory loss and substance abuse. The patient is not nervous/anxious and does not have insomnia.     Blood pressure 114/71, pulse 83, temperature 97.8 F (36.6 C), temperature source Oral, resp. rate 22, height _0  (1.753 m), weight 70.308 kg (155 lb), SpO2 100 %.Body mass index is 22.88 kg/(m^2).  General Appearance: Casual  Eye Contact::  Good  Speech:  Clear and Coherent  Volume:  Normal  Mood:  Euthymic  Affect:  Congruent  Thought Process:  Goal Directed  Orientation:  Full (Time, Place, and Person)  Thought Content:  Negative  Suicidal Thoughts:  No  Homicidal Thoughts:  No  Memory:  Immediate;  Fair Recent;   Fair Remote;   Fair  Judgement:  Fair  Insight:  Fair  Psychomotor Activity:  Normal  Concentration:  Fair  Recall:  [employer] of Knowledge:Fair  Language: Fair  Akathisia:  No  Handed:  Right  AIMS (if indicated):     Assets:  Communication Skills Desire for Improvement Financial Resources/Insurance Physical Health Resilience  ADL's:  Intact  Cognition: Impaired,  Mild  Sleep:      Treatment Plan Summary: Plan After review of the history and speaking with the patient the patient does not appear to meet commitment criteria. He has been calm and free of any aggression or dangerous behavior here in the hospital. He convincingly denies that he actually has any intention of harming anyone. He did not actually do anything violent before coming into the hospital. Patient appears to be prone to impulsive talk of violence when he is upset but not acutely intending to carry through on it. He is not currently psychotic and his mood  is fine. Not abusing substances. Patient was counseled about trying to control his temper and the things that he says to decrease the chance of being sent to the hospital. Counseled to talk with his treatment team about learning more appropriate negotiation skills. No change to current medicine. Discontinue involuntary commitment. Follow-up with primary care doctor and psychiatrist  Disposition: Patient does not meet criteria for psychiatric inpatient admission. Supportive therapy provided about ongoing stressors.  John Clapacs 10/30/2014 6:09 PM

## 2014-10-30 NOTE — ED Notes (Signed)
He is ambulating in the dayroom - I observed him going into the BR then walking back - waving as he goes back to his room   Appropriate to stimulation  No verbalized needs or concerns at this time  NAD assessed  Continue to monitor

## 2014-10-30 NOTE — ED Notes (Signed)
md Clapacs has consulted and he will be discharged to home  

## 2014-10-30 NOTE — ED Notes (Signed)
ED BHU PLACEMENT JUSTIFICATION Is the patient under IVC or is there intent for IVC: Yes.   Is the patient medically cleared: Yes.   Is there vacancy in the ED BHU: Yes.   Is the population mix appropriate for patient: Yes.   Is the patient awaiting placement in inpatient or outpatient setting: pending Has the patient had a psychiatric consult:  Consult pending Survey of unit performed for contraband, proper placement and condition of furniture, tampering with fixtures in bathroom, shower, and each patient room: Yes.  ; Findings:  APPEARANCE/BEHAVIOR Calm and cooperative NEURO ASSESSMENT Orientation: oriented x3  Denies pain Hallucinations: No.None noted (Hallucinations) Speech: Normal Gait: normal RESPIRATORY ASSESSMENT even unlabored respirations  CARDIOVASCULAR ASSESSMENT Pulses equal  regular rate  Skin warm and dry GASTROINTESTINAL ASSESSMENT no GI complaint EXTREMITIES Full ROM PLAN OF CARE Provide calm/safe environment. Vital signs assessed twice daily. ED BHU Assessment once each 12-hour shift. Collaborate with intake RN daily or as condition indicates. Assure the ED provider has rounded once each shift. Provide and encourage hygiene. Provide redirection as needed. Assess for escalating behavior; address immediately and inform ED provider.  Assess family dynamic and appropriateness for visitation as needed: Yes.  ; If necessary, describe findings:  Educate the patient/family about BHU procedures/visitation: Yes.  ; If necessary, describe findings:

## 2014-10-30 NOTE — ED Notes (Signed)
BEHAVIORAL HEALTH ROUNDING Patient sleeping: No. Patient alert and oriented: yes Behavior appropriate: Yes.   Nutrition and fluids offered: Yes  Toileting and hygiene offered: Yes  Sitter present: q15 min observations Law enforcement present: Yes [employer] 

## 2014-10-30 NOTE — ED Provider Notes (Signed)
-----------------------------------------   6:25 PM on 10/30/2014 -----------------------------------------  Patient has been seen and evaluated by psychiatry. They believe the patient is safe for discharge home to his group home. Patient agreeable.  Minna Antis, MD 10/30/14 [phone]

## 2014-10-30 NOTE — ED Notes (Signed)

## 2014-10-30 NOTE — ED Notes (Signed)
Sandwich and soft drink given.  

## 2014-10-30 NOTE — ED Notes (Signed)
Supper provided along with an extra drink  Pt observed with no unusual behavior  Appropriate to stimulation  No verbalized needs or concerns at this time  NAD assessed  Continue to monitor 

## 2014-10-30 NOTE — ED Notes (Signed)
Pt in room, pt sleeping at this time. No complaints or concerns voiced at this time. No abnormal behavior noted at this time. Will continue to monitor with q15 min checks. [employer] officer in area. 

## 2014-10-30 NOTE — ED Notes (Signed)

## 2014-10-30 NOTE — ED Notes (Signed)
lunch provided along with an extra drink    Appropriate to stimulation  No verbalized needs or concerns at this time  NAD assessed  Continue to monitor

## 2014-10-30 NOTE — ED Notes (Signed)
Report received from Amy T. RN. Pt. Alert and oriented in no distress denies SI, HI, AVH and pain.  Pt. Instructed to come to me with problems or concerns.Will continue to monitor for safety via security cameras and Q 15 minute checks. 

## 2014-10-30 NOTE — ED Notes (Signed)
Shower completed   Appropriate to stimulation  No verbalized needs or concerns at this time  NAD assessed  Continue to monitor

## 2014-10-30 NOTE — ED Notes (Signed)
Pt in room. No complaints or concerns voiced at this time. No abnormal behavior noted at this time. Will continue to monitor with q15 min checks. [employer] officer in area. 

## 2014-10-30 NOTE — ED Notes (Addendum)
This is a 49 y.o. Cauc. Male, who presents to the ED under IVC for c/o verbalizing threats of harm toward the group home. Per client, "They treat me like shit; they starve me; they are mean to me; the manager  there let the cable get cut off; we only have one station. I want to just burn the place down and kill everybody in there."

## 2014-10-30 NOTE — ED Notes (Signed)
Breakfast provided    Pt reporting  "I came back cause I am gonna kill them people at the group home  - I pay them 21 thousand dollars every month and they don't even have cable.  I ain't going back there - they do not take care of me."  Pt informed of pending psychiatrist consult  "I know - I'll go home today after I talk to the doctor."     Assessment completed  He denies pain

## 2014-10-30 NOTE — ED Notes (Signed)
Am meds administered as ordered.

## 2014-10-30 NOTE — ED Notes (Signed)
Pt. Noted in room. No complaints or concerns voiced. No distress or abnormal behavior noted. Will continue to monitor with security cameras. Q 15 minute rounds continue. 

## 2014-11-05 ENCOUNTER — Emergency Department
Admission: EM | Admit: 2014-11-05 | Discharge: 2014-11-10 | Disposition: A | Discharge status: Home / Self Care | Payer: Medicare Other | Attending: Emergency Medicine | Admitting: Emergency Medicine

## 2014-11-05 ENCOUNTER — Encounter: Payer: Self-pay | Admitting: Urgent Care

## 2014-11-05 DIAGNOSIS — F918 Other conduct disorders: Secondary | ICD-10-CM | POA: Insufficient documentation

## 2014-11-05 DIAGNOSIS — R451 Restlessness and agitation: Secondary | ICD-10-CM | POA: Insufficient documentation

## 2014-11-05 DIAGNOSIS — F209 Schizophrenia, unspecified: Secondary | ICD-10-CM | POA: Diagnosis not present

## 2014-11-05 DIAGNOSIS — F17200 Nicotine dependence, unspecified, uncomplicated: Secondary | ICD-10-CM | POA: Diagnosis present

## 2014-11-05 DIAGNOSIS — R4689 Other symptoms and signs involving appearance and behavior: Secondary | ICD-10-CM

## 2014-11-05 DIAGNOSIS — Z79899 Other long term (current) drug therapy: Secondary | ICD-10-CM | POA: Diagnosis not present

## 2014-11-05 DIAGNOSIS — Z046 Encounter for general psychiatric examination, requested by authority: Secondary | ICD-10-CM | POA: Diagnosis present

## 2014-11-05 DIAGNOSIS — Z720 Tobacco use: Secondary | ICD-10-CM | POA: Insufficient documentation

## 2014-11-05 DIAGNOSIS — F250 Schizoaffective disorder, bipolar type: Secondary | ICD-10-CM | POA: Diagnosis present

## 2014-11-05 DIAGNOSIS — S069X9A Unspecified intracranial injury with loss of consciousness of unspecified duration, initial encounter: Secondary | ICD-10-CM | POA: Diagnosis present

## 2014-11-05 DIAGNOSIS — S069XAA Unspecified intracranial injury with loss of consciousness status unknown, initial encounter: Secondary | ICD-10-CM | POA: Diagnosis present

## 2014-11-05 LAB — SALICYLATE LEVEL: Salicylate Lvl: <4 mg/dL (ref 2.8–30.0)

## 2014-11-05 LAB — URINALYSIS COMPLETE WITH MICROSCOPIC (ARMC ONLY)
BILIRUBIN URINE: NEGATIVE
Bacteria, UA: NONE SEEN
Glucose, UA: NEGATIVE mg/dL
Hgb urine dipstick: NEGATIVE
KETONES UR: NEGATIVE mg/dL
Leukocytes, UA: NEGATIVE
Nitrite: NEGATIVE
PH: 6 (ref 5.0–8.0)
PROTEIN: NEGATIVE mg/dL
SQUAMOUS EPITHELIAL / LPF: NONE SEEN
Specific Gravity, Urine: 1.012 (ref 1.005–1.030)
WBC, UA: NONE SEEN WBC/hpf (ref 0–5)

## 2014-11-05 LAB — COMPREHENSIVE METABOLIC PANEL
ALK PHOS: 68 U/L (ref 38–126)
ALT: 11 U/L — AB (ref 17–63)
AST: 16 U/L (ref 15–41)
Albumin: 4.5 g/dL (ref 3.5–5.0)
Anion gap: 8 (ref 5–15)
BUN: 16 mg/dL (ref 6–20)
CALCIUM: 9.7 mg/dL (ref 8.9–10.3)
CO2: 28 mmol/L (ref 22–32)
CREATININE: 0.89 mg/dL (ref 0.61–1.24)
Chloride: 104 mmol/L (ref 101–111)
GFR calc non Af Amer: >60 mL/min (ref >60)
GLUCOSE: 86 mg/dL (ref 65–99)
Potassium: 4.3 mmol/L (ref 3.5–5.1)
SODIUM: 140 mmol/L (ref 135–145)
Total Bilirubin: <0.1 mg/dL — ABNORMAL LOW (ref 0.3–1.2)
Total Protein: 7.2 g/dL (ref 6.5–8.1)

## 2014-11-05 LAB — CBC WITH DIFFERENTIAL/PLATELET
Basophils Absolute: 0 10*3/uL (ref 0–0.1)
Basophils Relative: 1 %
EOS ABS: 0.1 10*3/uL (ref 0–0.7)
EOS PCT: 1 %
HCT: 45.2 % (ref 40.0–52.0)
Hemoglobin: 15.2 g/dL (ref 13.0–18.0)
LYMPHS ABS: 2.5 10*3/uL (ref 1.0–3.6)
LYMPHS PCT: 26 %
MCH: 30.8 pg (ref 26.0–34.0)
MCHC: 33.7 g/dL (ref 32.0–36.0)
MCV: 91.4 fL (ref 80.0–100.0)
MONO ABS: 1.2 10*3/uL — AB (ref 0.2–1.0)
MONOS PCT: 13 %
Neutro Abs: 5.7 10*3/uL (ref 1.4–6.5)
Neutrophils Relative %: 59 %
PLATELETS: 171 10*3/uL (ref 150–440)
RBC: 4.94 MIL/uL (ref 4.40–5.90)
RDW: 13.8 % (ref 11.5–14.5)
WBC: 9.6 10*3/uL (ref 3.8–10.6)

## 2014-11-05 LAB — ETHANOL: Alcohol, Ethyl (B): <5 mg/dL (ref <5)

## 2014-11-05 LAB — URINE DRUG SCREEN, QUALITATIVE (ARMC ONLY)
AMPHETAMINES, UR SCREEN: NOT DETECTED
BENZODIAZEPINE, UR SCRN: NOT DETECTED
Barbiturates, Ur Screen: NOT DETECTED
Cannabinoid 50 Ng, Ur ~~LOC~~: NOT DETECTED
Cocaine Metabolite,Ur ~~LOC~~: NOT DETECTED
MDMA (ECSTASY) UR SCREEN: NOT DETECTED
METHADONE SCREEN, URINE: NOT DETECTED
OPIATE, UR SCREEN: NOT DETECTED
PHENCYCLIDINE (PCP) UR S: NOT DETECTED
Tricyclic, Ur Screen: NOT DETECTED

## 2014-11-05 LAB — ACETAMINOPHEN LEVEL

## 2014-11-05 NOTE — BH Assessment (Signed)
Assessment Note  Jaime Vargas is an 49 y.o. male presenting to the ED under IVC by [facility] department.  Patient has been threatening staff and residents at [facility] report that he wants to "burn down that damn place". Patient here 2 weeks ago for the same.  Pt reports not wanting to continue living at his group home.  Pt denies any SI and A/V hallucinations.  Pt also denies any drug/alcohol use.  Diagnosis: Schizoaffective d/o; homicidal  Past Medical History:  Past Medical History  Diagnosis Date  . Schizoaffective disorder (HCC)   . Traumatic brain injury (HCC)   . Heart murmur   . Headache     History reviewed. No pertinent past surgical history.  Family History:  Family History  Problem Relation Age of Onset  . Schizophrenia Father     Social History:  reports that he has been smoking Cigarettes.  He has a 78 pack-year smoking history. He has never used smokeless tobacco. He reports that he does not drink alcohol or use illicit drugs.  Additional Social History:  Alcohol / Drug Use History of alcohol / drug use?: Yes (Pt reports he drinks beer occassionally.)  CIWA: CIWA-Ar BP: 126/82 mmHg Pulse Rate: 90 COWS:    Allergies: No Known Allergies  Home Medications:  (Not in a hospital admission)  OB/GYN Status:  No LMP for male patient.  General Assessment Data Location of Assessment: [facility] ED TTS Assessment: In system Is this a Tele or Face-to-Face Assessment?: Face-to-Face Is this an Initial Assessment or a Re-assessment for this encounter?: Initial Assessment Marital status: Single Maiden name: N/A Is patient pregnant?: No Pregnancy Status: No Living Arrangements: Group Home Can pt return to current living arrangement?: Yes Admission Status: Involuntary Is patient capable of signing voluntary admission?: No Referral Source: Self/Family/Friend Insurance type: Medicare     Crisis Care Plan Living Arrangements: Group Home Name  of Psychiatrist: N/A Name of Therapist: N/A  Education Status Is patient currently in school?: No Current Grade: N/A Highest grade of school patient has completed: 12th Name of school: Investment banker, operational person: N/A  Risk to self with the past 6 months Suicidal Ideation: No Has patient been a risk to self within the past 6 months prior to admission? : No Suicidal Intent: No Has patient had any suicidal intent within the past 6 months prior to admission? : No Is patient at risk for suicide?: No Suicidal Plan?: No Has patient had any suicidal plan within the past 6 months prior to admission? : No Access to Means: No What has been your use of drugs/alcohol within the last 12 months?: None reported Previous Attempts/Gestures: No How many times?: 0 Other Self Harm Risks: None reported Triggers for Past Attempts: None known Intentional Self Injurious Behavior: None Family Suicide History: Unknown Recent stressful life event(s): Conflict (Comment) Persecutory voices/beliefs?: No Depression: No Substance abuse history and/or treatment for substance abuse?: No Suicide prevention information given to non-admitted patients: Not applicable  Risk to Others within the past 6 months Homicidal Ideation: Yes-Currently Present Does patient have any lifetime risk of violence toward others beyond the six months prior to admission? : No Thoughts of Harm to Others: Yes-Currently Present Comment - Thoughts of Harm to Others: Pt reports wanting to burn group home down. Current Homicidal Intent: Yes-Currently Present Current Homicidal Plan: Yes-Currently Present Describe Current Homicidal Plan: Pt reports plan to burn group home down. Access to Homicidal Means: No Identified Victim: group home History of harm  to others?: No Assessment of Violence: None Noted Violent Behavior Description: None Does patient have access to weapons?: No Criminal Charges Pending?: No Does patient have a court date:  No Is patient on probation?: No  Psychosis Hallucinations: None noted Delusions: None noted  Mental Status Report Appearance/Hygiene: Unremarkable, In scrubs Eye Contact: Good Motor Activity: Freedom of movement Speech: Logical/coherent Level of Consciousness: Alert Mood: Pleasant Affect: Appropriate to circumstance Anxiety Level: None Thought Processes: Circumstantial Judgement: Partial Orientation: Person, Place, Time, Situation Obsessive Compulsive Thoughts/Behaviors: Minimal  Cognitive Functioning Concentration: Normal Memory: Recent Intact IQ: Average Insight: Poor Impulse Control: Poor Appetite: Poor Weight Loss: 0 Weight Gain: 0 Sleep: No Change Total Hours of Sleep: 8 Vegetative Symptoms: None  ADLScreening [facility] Assessment Services) Patient's cognitive ability adequate to safely complete daily activities?: Yes Patient able to express need for assistance with ADLs?: Yes Independently performs ADLs?: Yes (appropriate for developmental age)  Prior Inpatient Therapy Prior Inpatient Therapy: Yes Prior Therapy Dates: 10/29/2014 Prior Therapy Facilty/Provider(s): [facility] Reason for Treatment: homicidal  Prior Outpatient Therapy Prior Outpatient Therapy: No Prior Therapy Dates: N/A Prior Therapy Facilty/Provider(s): N/A Reason for Treatment: N/A Does patient have an ACCT team?: No Does patient have Intensive In-House Services?  : No Does patient have [facility] services? : No Does patient have P4CC services?: No  ADL Screening (condition at time of admission) Patient's cognitive ability adequate to safely complete daily activities?: Yes Patient able to express need for assistance with ADLs?: Yes Independently performs ADLs?: Yes (appropriate for developmental age)       Abuse/Neglect Assessment (Assessment to be complete while patient is alone) Physical Abuse: Denies Verbal Abuse: Denies Sexual Abuse: Denies Exploitation of patient/patient's resources:  Denies Self-Neglect: Denies Values / Beliefs Cultural Requests During Hospitalization: None Spiritual Requests During Hospitalization: None Consults Spiritual Care Consult Needed: No Social Work Consult Needed: Yes (Comment) (Pt seeks assistance with group home placement.)      Additional Information 1:1 In Past 12 Months?: No CIRT Risk: No Elopement Risk: No     Disposition:  Disposition Initial Assessment Completed for this Encounter: Yes Disposition of Patient: Other dispositions Other disposition(s): Other (Comment)  On Site Evaluation by:   Reviewed with Physician:    [place] 11/05/2014 10:25 PM

## 2014-11-05 NOTE — ED Notes (Signed)
Patient assigned to appropriate care area. Patient oriented to unit/care area: Informed that, for their safety, care areas are designed for safety and visiting hours explained to patient. Patient verbalizes understanding, and verbal contract for safety obtained.  BEHAVIORAL HEALTH ROUNDING Patient sleeping: No. Patient alert and oriented: yes Behavior appropriate: Yes.   Nutrition and fluids offered: Yes  Toileting and hygiene offered: Yes  Sitter present: q15 min observations Law enforcement present: Yes [employer]  ENVIRONMENTAL ASSESSMENT Potentially harmful objects out of patient reach: Yes.   Personal belongings secured: Yes.   Patient dressed in hospital provided attire only: Yes.   Plastic bags out of patient reach: Yes.   Patient care equipment (cords, cables, call bells, lines, and drains) shortened, removed, or accounted for: Yes.   Equipment and supplies removed from bottom of stretcher: Yes.   Potentially toxic materials out of patient reach: Yes.   Sharps container removed or out of patient reach: Yes.   

## 2014-11-05 NOTE — ED Notes (Signed)
Patient presents in custody of Sales executive; under IVC. Papers indicate that patient has been threatening staff and residents at [facility] - (+) [place], reports that he wants to "burn down that damn place". Patient here x 2 weeks ago for the same - patient states while laughing, "I am coming here about every week now. I come here because you have pretty women and they dont. All they have is old women with diseases. Who wants an damn old woman? What can you do with her?" Patient reported to be using racial slurs at facility. (+) aggression and curing also reported.

## 2014-11-05 NOTE — ED Provider Notes (Signed)
[facility] Department Provider Note    ____________________________________________  Time seen: 2325  I have reviewed the triage vital signs and the nursing notes.   HISTORY  Chief Complaint Psychiatric Evaluation   History limited by: Poor historian   HPI Jaime Vargas is a 49 y.o. male who comes from group home today under IVC because of threatening behavior. The patient does admit to threatening the staff. He states that he did this because they wouldn't take his stuff. Additionally he would state that they would take his seats when he would get up to smoke. He has made threats in the past. Additionally there is some concern that is been using racial slurs. He denies any medical complaints.   Past Medical History  Diagnosis Date  . Schizoaffective disorder (HCC)   . Traumatic brain injury (HCC)   . Heart murmur   . Headache     Patient Active Problem List   Diagnosis Date Noted  . Tobacco use disorder 10/20/2014  . Schizoaffective disorder, bipolar type (HCC) 10/19/2014  . Traumatic brain injury (HCC) 10/19/2014    History reviewed. No pertinent past surgical history.  Current Outpatient Rx  Name  Route  Sig  Dispense  Refill  . divalproex (DEPAKOTE) 500 MG DR tablet   Oral   Take 1 tablet (500 mg total) by mouth every 12 (twelve) hours.   60 tablet   0   . hydrOXYzine (ATARAX/VISTARIL) 50 MG tablet   Oral   Take 1 tablet (50 mg total) by mouth 3 (three) times daily as needed.   90 tablet   0   . levothyroxine (SYNTHROID, LEVOTHROID) 125 MCG tablet   Oral   Take 125 mcg by mouth daily before breakfast.         . mirtazapine (REMERON) 30 MG tablet   Oral   Take 1 tablet (30 mg total) by mouth at bedtime.   30 tablet   0   . oxcarbazepine (TRILEPTAL) 600 MG tablet   Oral   Take 1 tablet (600 mg total) by mouth 2 (two) times daily.   60 tablet   0   . paliperidone (INVEGA) 6 MG 24 hr tablet   Oral   Take 1  tablet (6 mg total) by mouth daily.   30 tablet   0     Allergies Review of patient's allergies indicates no known allergies.  Family History  Problem Relation Age of Onset  . Schizophrenia Father     Social History Social History  Substance Use Topics  . Smoking status: Current Every Day Smoker -- 2.00 packs/day for 39 years    Types: Cigarettes  . Smokeless tobacco: Never Used  . Alcohol Use: No    Review of Systems  Constitutional: Negative for fever. Cardiovascular: Negative for chest pain. Respiratory: Negative for shortness of breath. Gastrointestinal: Negative for abdominal pain, vomiting and diarrhea. Genitourinary: Negative for dysuria. Musculoskeletal: Negative for back pain. Skin: Negative for rash. Neurological: Negative for headaches, focal weakness or numbness.  10-point ROS otherwise negative.  ____________________________________________   PHYSICAL EXAM:  VITAL SIGNS: ED Triage Vitals  Enc Vitals Group     BP 11/05/14 1916 126/82 mmHg     Pulse Rate 11/05/14 1916 90     Resp 11/05/14 1916 18     Temp 11/05/14 1916 98.1 F (36.7 C)     Temp Source 11/05/14 1916 Oral     SpO2 11/05/14 1916 98 %     Weight  11/05/14 1916 150 lb (68.04 kg)     Height 11/05/14 1916 5' 9.5" (1.765 m)     Head Cir --      Peak Flow --      Pain Score 11/05/14 1917 0   Constitutional: Alert and oriented. Well appearing and in no distress. Eyes: Conjunctivae are normal. PERRL. Normal extraocular movements. ENT   Head: Normocephalic and atraumatic.   Nose: No congestion/rhinnorhea.   Mouth/Throat: Mucous membranes are moist.   Neck: No stridor. Hematological/Lymphatic/Immunilogical: No cervical lymphadenopathy. Cardiovascular: Normal rate, regular rhythm.  No murmurs, rubs, or gallops. Respiratory: Normal respiratory effort without tachypnea nor retractions. Breath sounds are clear and equal bilaterally. No wheezes/rales/rhonchi. Gastrointestinal:  Soft and nontender. No distention. There is no CVA tenderness. Genitourinary: Deferred Musculoskeletal: Normal range of motion in all extremities. No joint effusions.  No lower extremity tenderness nor edema. Neurologic:  Normal speech and language. No gross focal neurologic deficits are appreciated. Speech is normal.  Skin:  Skin is warm, dry and intact. No rash noted.  ____________________________________________    LABS (pertinent positives/negatives)  Labs Reviewed  COMPREHENSIVE METABOLIC PANEL - Abnormal; Notable for the following:    ALT 11 (*)    Total Bilirubin <0.1 (*)    All other components within normal limits  CBC WITH DIFFERENTIAL/PLATELET - Abnormal; Notable for the following:    Monocytes Absolute 1.2 (*)    All other components within normal limits  URINALYSIS COMPLETEWITH MICROSCOPIC ([facility] ONLY) - Abnormal; Notable for the following:    Color, Urine YELLOW (*)    APPearance CLEAR (*)    All other components within normal limits  ACETAMINOPHEN LEVEL - Abnormal; Notable for the following:    Acetaminophen (Tylenol), Serum <10 (*)    All other components within normal limits  ETHANOL  URINE DRUG SCREEN, QUALITATIVE ([facility] ONLY)  SALICYLATE LEVEL     ____________________________________________   EKG  None  ____________________________________________    RADIOLOGY  None   ____________________________________________   PROCEDURES  Procedure(s) performed: None  Critical Care performed: No  ____________________________________________   INITIAL IMPRESSION / ASSESSMENT AND PLAN / ED COURSE  Pertinent labs & imaging results that were available during my care of the patient were reviewed by me and considered in my medical decision making (see chart for details).  Patient presents from group home today because of making threats and he presented under IVC. Patient does endorse threats today. Will continue IVC and have psychiatry come and evaluate  the patient.  ____________________________________________   FINAL CLINICAL IMPRESSION(S) / ED DIAGNOSES  Aggressive behavior  Phineas Semen, MD 11/05/14 2336

## 2014-11-06 DIAGNOSIS — F250 Schizoaffective disorder, bipolar type: Secondary | ICD-10-CM

## 2014-11-06 DIAGNOSIS — F918 Other conduct disorders: Secondary | ICD-10-CM | POA: Diagnosis not present

## 2014-11-06 MED ORDER — NICOTINE 21 MG/24HR TD PT24
21.0000 mg | MEDICATED_PATCH | Freq: Once | TRANSDERMAL | Status: AC
  Administered 2014-11-06: 21 mg via TRANSDERMAL
  Filled 2014-11-06: qty 1

## 2014-11-06 NOTE — ED Notes (Signed)
Pt. To [facility] from ED ambulatory without difficulty, to room 2 . Report from Beth RN. Pt. Is alert and oriented, warm and dry in no distress. Pt. Denies SI, HI, and AVH. Pt. Calm and cooperative. Pt. Made aware of security cameras and Q15 minute rounds. Pt. Encouraged to let Nursing staff know of any concerns or needs.  

## 2014-11-06 NOTE — ED Provider Notes (Signed)
-----------------------------------------   9:47 PM on 11/06/2014 -----------------------------------------  Patient has been seen and evaluated by psychiatry, they believe the patient is safe for discharge home at this time to his group home.  Minna Antis, MD 11/06/14 2147

## 2014-11-06 NOTE — Consult Note (Signed)
[facility] Face-to-Face Psychiatry Consult   Reason for Consult:  Consult for this 49 year old man with a history of schizoaffective disorder and traumatic brain injury who presents under commitment from his group home Referring Physician:  Archie Balboa Patient Identification: Jaime Vargas MRN:  [id number] Principal Diagnosis: Schizoaffective disorder, bipolar type [facility]) Diagnosis:   Patient Active Problem List   Diagnosis Date Noted  . Tobacco use disorder [F17.200] 10/20/2014  . Schizoaffective disorder, bipolar type ([place]) [F25.0] 10/19/2014  . Traumatic brain injury [facility]) [S06.9X9A] 10/19/2014    Total Time spent with patient: 45 minutes  Subjective:   Jaime Vargas is a 49 y.o. male patient admitted with "I am going to get some money from those people".  HPI:  Patient was brought in under petition filed by his group home which states that he has threatened to burn down the group home again. This is the same threat that he apparently makes on a pretty regular basis. Patient tells me that he made no such threat. He says that he had no thoughts about actually hurting anybody or killing anyone. He focuses on his dissatisfaction with the finances at the group home. He believes that the group home is obliged to give him his money more quickly than they are doing. He also has the belief that they cheated him out of money for a couple of years. He says he intends to try and deal with this by suing them. He says his mood has been generally good. Sleep is been a little bit poor but no different than usual. No new physical complaints. He says he has been compliant with medication. He doesn't identify anything except the finances as being an acute stress. This is the week that the checks come in and that seems to probably set him off. Denies any suicidal ideation.  Past psychiatric history: Patient has had multiple visits to the emergency room and at least 1 hospitalization. Similar setting every time. He gets  into arguments at the group home over some perceived dissatisfaction and then allegedly makes threats. Doesn't seem that he's actually hurt anyone. No known history of suicide attempts.  Family history: Patient denies that there is any family history of mental illness or substance abuse.  Social history: Patient is residing at a local group home. Family don't seem like they're very involved. The conflict with the group home over various things that he is dissatisfied with.  Medical history: There is a history of a traumatic brain injury as a young man which is probably related to his impulsivity and concrete thinking.  Past Psychiatric History: Past history of several presentations to the emergency room. He was hospitalized earlier in the summer. No known history of actual serious violence or suicide attempts. Diagnosis of schizoaffective disorder and traumatic brain injury. Currently managed on psychiatric medication.  Risk to Self: Suicidal Ideation: No Suicidal Intent: No Is patient at risk for suicide?: No Suicidal Plan?: No Access to Means: No What has been your use of drugs/alcohol within the last 12 months?: None reported How many times?: 0 Other Self Harm Risks: None reported Triggers for Past Attempts: None known Intentional Self Injurious Behavior: None Risk to Others: Homicidal Ideation: Yes-Currently Present Thoughts of Harm to Others: Yes-Currently Present Comment - Thoughts of Harm to Others: Pt reports wanting to burn group home down. Current Homicidal Intent: Yes-Currently Present Current Homicidal Plan: Yes-Currently Present Describe Current Homicidal Plan: Pt reports plan to burn group home down. Access to Homicidal Means: No Identified Victim: group  home History of harm to others?: No Assessment of Violence: None Noted Violent Behavior Description: None Does patient have access to weapons?: No Criminal Charges Pending?: No Does patient have a court date: No Prior  Inpatient Therapy: Prior Inpatient Therapy: Yes Prior Therapy Dates: 10/29/2014 Prior Therapy Facilty/Provider(s): [facility] Reason for Treatment: homicidal Prior Outpatient Therapy: Prior Outpatient Therapy: No Prior Therapy Dates: N/A Prior Therapy Facilty/Provider(s): N/A Reason for Treatment: N/A Does patient have an ACCT team?: No Does patient have Intensive In-House Services?  : No Does patient have [facility] services? : No Does patient have P4CC services?: No  Past Medical History:  Past Medical History  Diagnosis Date  . Schizoaffective disorder ([place])   . Traumatic brain injury ([place])   . Heart murmur   . Headache    History reviewed. No pertinent past surgical history. Family History:  Family History  Problem Relation Age of Onset  . Schizophrenia Father    Family Psychiatric  History: He denies there is any family history of psychiatric illness. Social History:  History  Alcohol Use No     History  Drug Use No    Comment: Patient reports past use    Social History   Social History  . Marital Status: Divorced    Spouse Name: N/A  . Number of Children: N/A  . Years of Education: N/A   Social History Main Topics  . Smoking status: Current Every Day Smoker -- 2.00 packs/day for 39 years    Types: Cigarettes  . Smokeless tobacco: Never Used  . Alcohol Use: No  . Drug Use: No     Comment: Patient reports past use  . Sexual Activity: Yes    Birth Control/ Protection: None   Other Topics Concern  . None   Social History Narrative   Additional Social History:    History of alcohol / drug use?: Yes (Pt reports he drinks beer occassionally.)                     Allergies:  No Known Allergies  Labs:  Results for orders placed or performed during the hospital encounter of 11/05/14 (from the past 48 hour(s))  Comprehensive metabolic panel     Status: Abnormal   Collection Time: 11/05/14  7:39 PM  Result Value Ref Range   Sodium 140 135 - 145 mmol/L    Potassium 4.3 3.5 - 5.1 mmol/L   Chloride 104 101 - 111 mmol/L   CO2 28 22 - 32 mmol/L   Glucose, Bld 86 65 - 99 mg/dL   BUN 16 6 - 20 mg/dL   Creatinine, Ser 0.89 0.61 - 1.24 mg/dL   Calcium 9.7 8.9 - 10.3 mg/dL   Total Protein 7.2 6.5 - 8.1 g/dL   Albumin 4.5 3.5 - 5.0 g/dL   AST 16 15 - 41 U/L   ALT 11 (L) 17 - 63 U/L   Alkaline Phosphatase 68 38 - 126 U/L   Total Bilirubin <0.1 (L) 0.3 - 1.2 mg/dL   GFR calc non Af Amer >60 >60 mL/min   GFR calc Af Amer >60 >60 mL/min    Comment: (NOTE) The eGFR has been calculated using the CKD EPI equation. This calculation has not been validated in all clinical situations. eGFR's persistently <60 mL/min signify possible Chronic Kidney Disease.    Anion gap 8 5 - 15  Ethanol     Status: None   Collection Time: 11/05/14  7:39 PM  Result Value Ref Range  Alcohol, Ethyl (B) <5 <5 mg/dL    Comment:        LOWEST DETECTABLE LIMIT FOR SERUM ALCOHOL IS 5 mg/dL FOR MEDICAL PURPOSES ONLY   CBC with Diff     Status: Abnormal   Collection Time: 11/05/14  7:39 PM  Result Value Ref Range   WBC 9.6 3.8 - 10.6 K/uL   RBC 4.94 4.40 - 5.90 MIL/uL   Hemoglobin 15.2 13.0 - 18.0 g/dL   HCT 45.2 40.0 - 52.0 %   MCV 91.4 80.0 - 100.0 fL   MCH 30.8 26.0 - 34.0 pg   MCHC 33.7 32.0 - 36.0 g/dL   RDW 13.8 11.5 - 14.5 %   Platelets 171 150 - 440 K/uL   Neutrophils Relative % 59 %   Neutro Abs 5.7 1.4 - 6.5 K/uL   Lymphocytes Relative 26 %   Lymphs Abs 2.5 1.0 - 3.6 K/uL   Monocytes Relative 13 %   Monocytes Absolute 1.2 (H) 0.2 - 1.0 K/uL   Eosinophils Relative 1 %   Eosinophils Absolute 0.1 0 - 0.7 K/uL   Basophils Relative 1 %   Basophils Absolute 0.0 0 - 0.1 K/uL  Urinalysis complete, with microscopic ([facility] only)     Status: Abnormal   Collection Time: 11/05/14  7:39 PM  Result Value Ref Range   Color, Urine YELLOW (A) YELLOW   APPearance CLEAR (A) CLEAR   Glucose, UA NEGATIVE NEGATIVE mg/dL   Bilirubin Urine NEGATIVE NEGATIVE   Ketones, ur  NEGATIVE NEGATIVE mg/dL   Specific Gravity, Urine 1.012 1.005 - 1.030   Hgb urine dipstick NEGATIVE NEGATIVE   pH 6.0 5.0 - 8.0   Protein, ur NEGATIVE NEGATIVE mg/dL   Nitrite NEGATIVE NEGATIVE   Leukocytes, UA NEGATIVE NEGATIVE   RBC / HPF 0-5 0 - 5 RBC/hpf   WBC, UA NONE SEEN 0 - 5 WBC/hpf   Bacteria, UA NONE SEEN NONE SEEN   Squamous Epithelial / LPF NONE SEEN NONE SEEN   Mucous PRESENT   Urine Drug Screen, Qualitative ([facility] only)     Status: None   Collection Time: 11/05/14  7:39 PM  Result Value Ref Range   Tricyclic, Ur Screen NONE DETECTED NONE DETECTED   Amphetamines, Ur Screen NONE DETECTED NONE DETECTED   MDMA (Ecstasy)Ur Screen NONE DETECTED NONE DETECTED   Cocaine Metabolite,Ur [place] NONE DETECTED NONE DETECTED   Opiate, Ur Screen NONE DETECTED NONE DETECTED   Phencyclidine (PCP) Ur S NONE DETECTED NONE DETECTED   Cannabinoid 50 Ng, Ur [place] NONE DETECTED NONE DETECTED   Barbiturates, Ur Screen NONE DETECTED NONE DETECTED   Benzodiazepine, Ur Scrn NONE DETECTED NONE DETECTED   Methadone Scn, Ur NONE DETECTED NONE DETECTED    Comment: (NOTE) 779  Tricyclics, urine               Cutoff 1000 ng/mL 200  Amphetamines, urine             Cutoff 1000 ng/mL 300  MDMA (Ecstasy), urine           Cutoff 500 ng/mL 400  Cocaine Metabolite, urine       Cutoff 300 ng/mL 500  Opiate, urine                   Cutoff 300 ng/mL 600  Phencyclidine (PCP), urine      Cutoff 25 ng/mL 700  Cannabinoid, urine              Cutoff 50  ng/mL 800  Barbiturates, urine             Cutoff 200 ng/mL 900  Benzodiazepine, urine           Cutoff 200 ng/mL 1000 Methadone, urine                Cutoff 300 ng/mL 1100 1200 The urine drug screen provides only a preliminary, unconfirmed 1300 analytical test result and should not be used for non-medical 1400 purposes. Clinical consideration and professional judgment should 1500 be applied to any positive drug screen result due to possible 1600 interfering  substances. A more specific alternate chemical method 1700 must be used in order to obtain a confirmed analytical result.  1800 Gas chromato graphy / mass spectrometry (GC/MS) is the preferred 1900 confirmatory method.   Salicylate level     Status: None   Collection Time: 11/05/14  7:39 PM  Result Value Ref Range   Salicylate Lvl <0.8 2.8 - 30.0 mg/dL  Acetaminophen level     Status: Abnormal   Collection Time: 11/05/14  7:39 PM  Result Value Ref Range   Acetaminophen (Tylenol), Serum <10 (L) 10 - 30 ug/mL    Comment:        THERAPEUTIC CONCENTRATIONS VARY SIGNIFICANTLY. A RANGE OF 10-30 ug/mL MAY BE AN EFFECTIVE CONCENTRATION FOR MANY PATIENTS. HOWEVER, SOME ARE BEST TREATED AT CONCENTRATIONS OUTSIDE THIS RANGE. ACETAMINOPHEN CONCENTRATIONS >150 ug/mL AT 4 HOURS AFTER INGESTION AND >50 ug/mL AT 12 HOURS AFTER INGESTION ARE OFTEN ASSOCIATED WITH TOXIC REACTIONS.     Current Facility-Administered Medications  Medication Dose Route Frequency Provider Last Rate Last Dose  . nicotine (NICODERM CQ - dosed in mg/24 hours) patch 21 mg  21 mg Transdermal Once Delman Kitten, MD   21 mg at 11/06/14 [id number]   Current Outpatient Prescriptions  Medication Sig Dispense Refill  . divalproex (DEPAKOTE) 500 MG DR tablet Take 1 tablet (500 mg total) by mouth every 12 (twelve) hours. 60 tablet 0  . hydrOXYzine (ATARAX/VISTARIL) 50 MG tablet Take 1 tablet (50 mg total) by mouth 3 (three) times daily as needed. 90 tablet 0  . levothyroxine (SYNTHROID, LEVOTHROID) 125 MCG tablet Take 125 mcg by mouth daily before breakfast.    . mirtazapine (REMERON) 30 MG tablet Take 1 tablet (30 mg total) by mouth at bedtime. 30 tablet 0  . oxcarbazepine (TRILEPTAL) 600 MG tablet Take 1 tablet (600 mg total) by mouth 2 (two) times daily. 60 tablet 0  . paliperidone (INVEGA) 6 MG 24 hr tablet Take 1 tablet (6 mg total) by mouth daily. 30 tablet 0    Musculoskeletal: Strength & Muscle Tone: within normal  limits Gait & Station: normal Patient leans: N/A  Psychiatric Specialty Exam: Review of Systems  Constitutional: Negative.   HENT: Negative.   Eyes: Negative.   Respiratory: Negative.   Cardiovascular: Negative.   Gastrointestinal: Negative.   Musculoskeletal: Negative.   Skin: Negative.   Neurological: Negative.   Psychiatric/Behavioral: Negative for depression, suicidal ideas, hallucinations, memory loss and substance abuse. The patient is nervous/anxious. The patient does not have insomnia.     Blood pressure 120/72, pulse 85, temperature 97.6 F (36.4 C), temperature source Oral, resp. rate 18, height 5' 9.5" (1.765 m), weight 68.04 kg (150 lb), SpO2 99 %.Body mass index is 21.84 kg/(m^2).  General Appearance: Casual  Eye Contact::  Good  Speech:  Clear and Coherent  Volume:  Increased  Mood:  Irritable  Affect:  Full Range  Thought Process:  Tangential  Orientation:  Full (Time, Place, and Person)  Thought Content:  Negative  Suicidal Thoughts:  No  Homicidal Thoughts:  No  Memory:  Immediate;   Fair Recent;   Fair Remote;   Fair  Judgement:  Impaired  Insight:  Shallow  Psychomotor Activity:  Normal  Concentration:  Fair  Recall:  [place]  Language: Fair  Akathisia:  No  Handed:  Right  AIMS (if indicated):     Assets:  Financial Resources/Insurance Housing Physical Health Resilience  ADL's:  Intact  Cognition: Impaired,  Mild  Sleep:      Treatment Plan Summary: Plan Patient interviewed case reviewed. Patient is presenting the same way he has on multiple times to the emergency room. This appears to be his baseline. He is completely denying having any thoughts about hurting anyone. He states he intends to try to sue the group home owner but not to do anything violent. He does not appear to be acutely psychotic. Denies suicidal ideation. Medically stable. No indication for hospitalization. No indication to change any of his medications.  Patient has been counseled about the fact that he is not likely to get what he wants by being loud and argumentative. We will be releasing him back to his group home. Schizoaffective disorder stable. Dramatic brain injury stable.  Disposition: Patient does not meet criteria for psychiatric inpatient admission. Supportive therapy provided about ongoing stressors.  Beatrice Sehgal 11/06/2014 7:48 PM

## 2014-11-06 NOTE — ED Notes (Signed)
Pt. Noted sleeping in room. No complaints or concerns voiced. No distress or abnormal behavior noted. Will continue to monitor with security cameras. Q 15 minute rounds continue. 

## 2014-11-06 NOTE — ED Notes (Signed)
BEHAVIORAL HEALTH ROUNDING Patient sleeping: Yes.   Patient alert and oriented: not applicable Behavior appropriate: Yes.    Nutrition and fluids offered: No Toileting and hygiene offered: No Sitter present: q15 minute observations Law enforcement present: Yes [employer] 

## 2014-11-06 NOTE — ED Notes (Signed)
Patient resting quietly in room. No noted distress or abnormal behaviors noted. Will continue 15 minute checks and observation by security camera for safety. 

## 2014-11-06 NOTE — ED Notes (Signed)
Meal given

## 2014-11-06 NOTE — ED Notes (Addendum)
Patient resting quietly in room. No noted distress or abnormal behaviors noted. Will continue 15 minute checks and observation by security camera for safety. 

## 2014-11-06 NOTE — ED Notes (Signed)

## 2014-11-06 NOTE — ED Notes (Signed)
Pt. Noted in rest room. No complaints or concerns voiced. No distress or abnormal behavior noted. Will continue to monitor with security cameras. Q 15 minute rounds continue.  

## 2014-11-06 NOTE — ED Notes (Signed)
Called group home to let them know pt. Has been discharged.  Stated they would have transportation for pt. In the morning. [phone]

## 2014-11-06 NOTE — ED Notes (Signed)
BEHAVIORAL HEALTH ROUNDING Patient sleeping: No. Patient alert and oriented: yes Behavior appropriate: Yes.  ; If no, describe:  Nutrition and fluids offered: Yes  Toileting and hygiene offered: Yes  Sitter present: no Law enforcement present: Yes  

## 2014-11-06 NOTE — ED Notes (Signed)
Report given to [facility]. Pt to transfer to [facility] room 2.  Pt tranferred with ED Tech Alissa and ODS Officer

## 2014-11-06 NOTE — ED Notes (Signed)

## 2014-11-06 NOTE — BHH Counselor (Signed)
Per Psych MD (Dr. Toni Amend), the patient is to be discharged back to the Group Home.  Writer attempted to contacted patient's Group Home, [facility] [phone]) but the facilities number was not working. Attempted to contact the administrator, [name] [phone]) but was unsuccessful. Writer left a HIPPA compliant message, requesting a phone call. Writer updated the patient's nurse (Amy H).

## 2014-11-06 NOTE — ED Notes (Signed)
Watching television. Maintained on 15 minute checks and observation by security camera for safety.

## 2014-11-06 NOTE — ED Notes (Signed)
Patient asleep in room. No noted distress or abnormal behavior. Will continue 15 minute checks and observation by security cameras for safety. 

## 2014-11-07 DIAGNOSIS — F918 Other conduct disorders: Secondary | ICD-10-CM | POA: Diagnosis not present

## 2014-11-07 MED ORDER — MIRTAZAPINE 15 MG PO TABS
30.0000 mg | ORAL_TABLET | Freq: Every day | ORAL | Status: DC
  Administered 2014-11-07 – 2014-11-09 (×3): 30 mg via ORAL
  Filled 2014-11-07 (×3): qty 2

## 2014-11-07 MED ORDER — NICOTINE 21 MG/24HR TD PT24
21.0000 mg | MEDICATED_PATCH | Freq: Once | TRANSDERMAL | Status: AC
  Administered 2014-11-07: 21 mg via TRANSDERMAL
  Filled 2014-11-07: qty 1

## 2014-11-07 MED ORDER — DIPHENHYDRAMINE HCL 25 MG PO CAPS
ORAL_CAPSULE | ORAL | Status: AC
  Administered 2014-11-07: 25 mg via ORAL
  Filled 2014-11-07: qty 1

## 2014-11-07 MED ORDER — OXCARBAZEPINE 300 MG PO TABS
600.0000 mg | ORAL_TABLET | Freq: Two times a day (BID) | ORAL | Status: DC
  Administered 2014-11-07 – 2014-11-10 (×6): 600 mg via ORAL
  Filled 2014-11-07 (×6): qty 2

## 2014-11-07 MED ORDER — LEVOTHYROXINE SODIUM 125 MCG PO TABS
125.0000 ug | ORAL_TABLET | Freq: Every day | ORAL | Status: DC
  Administered 2014-11-08 – 2014-11-10 (×3): 125 ug via ORAL
  Filled 2014-11-07 (×4): qty 1

## 2014-11-07 MED ORDER — DIPHENHYDRAMINE HCL 25 MG PO CAPS
25.0000 mg | ORAL_CAPSULE | Freq: Once | ORAL | Status: AC
Start: 2014-11-07 — End: 2014-11-07
  Administered 2014-11-07: 25 mg via ORAL

## 2014-11-07 MED ORDER — DIVALPROEX SODIUM 500 MG PO DR TAB
500.0000 mg | DELAYED_RELEASE_TABLET | Freq: Two times a day (BID) | ORAL | Status: DC
  Administered 2014-11-07 – 2014-11-10 (×6): 500 mg via ORAL
  Filled 2014-11-07 (×6): qty 1

## 2014-11-07 MED ORDER — PALIPERIDONE ER 3 MG PO TB24
6.0000 mg | ORAL_TABLET | Freq: Every day | ORAL | Status: DC
  Administered 2014-11-07 – 2014-11-10 (×4): 6 mg via ORAL
  Filled 2014-11-07 (×4): qty 2

## 2014-11-07 NOTE — ED Notes (Signed)
BEHAVIORAL HEALTH ROUNDING Patient sleeping: No. Patient alert and oriented: yes Behavior appropriate: Yes.  ; If no, describe:  Nutrition and fluids offered: Yes  Toileting and hygiene offered: Yes  Sitter present: no Law enforcement present: Yes  

## 2014-11-07 NOTE — ED Notes (Signed)
BEHAVIORAL HEALTH ROUNDING Patient sleeping: No. Patient alert and oriented: yes Behavior appropriate: Yes.  ; If no, describe:  Nutrition and fluids offered: yes Toileting and hygiene offered: Yes  Sitter present: q15 minute observations and security camera monitoring Law enforcement present: Yes  [facility]  

## 2014-11-07 NOTE — ED Notes (Signed)

## 2014-11-07 NOTE — ED Notes (Signed)
Supper provided along with an extra drink  Pt observed lying in bed - watching TV   Pt visualized with NAD  No verbalized needs or concerns at this time  Continue to monitor 

## 2014-11-07 NOTE — BHH Counselor (Signed)
Writer called [facility] ([phone]) to inform them the Pt is ready for discharge.  DeAnn from the home is working on transportation and will call back when she has someone on their way.   Anne Shutter, LPCA Therapeutic Triage Specialist

## 2014-11-07 NOTE — ED Notes (Signed)
BEHAVIORAL HEALTH ROUNDING  Patient sleeping: Yes.  Patient alert and oriented: no  Behavior appropriate: Yes. ; If no, describe:  Nutrition and fluids offered: No  Toileting and hygiene offered: No  Sitter present: no  Law enforcement present: Yes   

## 2014-11-07 NOTE — ED Notes (Signed)
Patient denies pain and is resting comfortably.  

## 2014-11-07 NOTE — ED Notes (Signed)
Breakfast provided    - mayonnaise also provided   Pt sitting at the foot of the bed  Pt visualized with NAD  No verbalized needs or concerns at this time  Continue to monitor

## 2014-11-07 NOTE — ED Provider Notes (Signed)
-----------------------------------------   7:12 AM on 11/07/2014 -----------------------------------------   Blood pressure 120/72, pulse 85, temperature 97.6 F (36.4 C), temperature source Oral, resp. rate 18, height 5' 9.5" (1.765 m), weight 150 lb (68.04 kg), SpO2 99 %.  The patient had no acute events since last update.  Calm and cooperative at this time.  Disposition is pending per Psychiatry/Behavioral Medicine team recommendations.     Arnaldo Natal, MD 11/07/14 [phone]

## 2014-11-07 NOTE — ED Notes (Signed)
ED BHU PLACEMENT JUSTIFICATION Is the patient under IVC or is there intent for IVC:  IV rescinded  Is the patient medically cleared: Yes.   Is there vacancy in the ED BHU: Yes.   Is the population mix appropriate for patient: Yes.   Is the patient awaiting placement in inpatient or outpatient setting: return to group home Has the patient had a psychiatric consult: Yes.   Survey of unit performed for contraband, proper placement and condition of furniture, tampering with fixtures in bathroom, shower, and each patient room: Yes.  ; Findings:  APPEARANCE/BEHAVIOR Calm and cooperative NEURO ASSESSMENT Orientation: oriented x3  Denies pain Hallucinations: No.None noted (Hallucinations) Speech: Normal Gait: normal RESPIRATORY ASSESSMENT Even  Unlabored respirations  CARDIOVASCULAR ASSESSMENT Pulses equal   regular rate  Skin warm and dry   GASTROINTESTINAL ASSESSMENT no GI complaint EXTREMITIES Full ROM  PLAN OF CARE Provide calm/safe environment. Vital signs assessed twice daily. ED BHU Assessment once each 12-hour shift. Collaborate with intake RN daily or as condition indicates. Assure the ED provider has rounded once each shift. Provide and encourage hygiene. Provide redirection as needed. Assess for escalating behavior; address immediately and inform ED provider.  Assess family dynamic and appropriateness for visitation as needed: Yes.  ; If necessary, describe findings:  Educate the patient/family about BHU procedures/visitation: Yes.  ; If necessary, describe findings:

## 2014-11-07 NOTE — ED Notes (Signed)
Pt. Up using bathroom. 

## 2014-11-07 NOTE — ED Notes (Signed)
He has been ambulating in the dayroom  - back and forth to the BR   Appropriate to stimulation  No verbalized needs or concerns at this time  NAD assessed  Continue to monitor

## 2014-11-07 NOTE — ED Notes (Signed)
Called pharmacy to get patient medications released early for patient.  Pt. Group home would not pick up patient this morning.

## 2014-11-07 NOTE — ED Notes (Signed)
Pt. Not sleeping.  Asked pt. If he wanted something to help him sleep.  Pt. States he wants to  Enjoy his last evening here before going back to group home.

## 2014-11-07 NOTE — ED Notes (Signed)
Pt observed lying in bed - watching TV   Pt visualized with NAD  No verbalized needs or concerns at this time  Continue to monitor 

## 2014-11-07 NOTE — ED Notes (Signed)
Rest home has now refused to pick up this pt  - pt has been made aware    Daily meds to be ordered   Pt observed with no unusual behavior  Appropriate to stimulation  No verbalized needs or concerns at this time  NAD assessed  Continue to monitor

## 2014-11-07 NOTE — ED Notes (Signed)
ED BHU PLACEMENT JUSTIFICATION Is the patient under IVC or is there intent for IVC: No. Is the patient medically cleared: Yes.   Is there vacancy in the ED BHU: Yes.   Is the population mix appropriate for patient: Yes.   Is the patient awaiting placement in inpatient or outpatient setting: No. Has the patient had a psychiatric consult: Yes.   Survey of unit performed for contraband, proper placement and condition of furniture, tampering with fixtures in bathroom, shower, and each patient room: Yes.  ; Findings:  APPEARANCE/BEHAVIOR calm, cooperative and adequate rapport can be established NEURO ASSESSMENT Orientation: time, place and person Hallucinations: No.None noted (Hallucinations) Speech: Normal Gait: normal RESPIRATORY ASSESSMENT Normal expansion.  Clear to auscultation.  No rales, rhonchi, or wheezing. CARDIOVASCULAR ASSESSMENT regular rate and rhythm, S1, S2 normal, no murmur, click, rub or gallop GASTROINTESTINAL ASSESSMENT soft, nontender, BS WNL, no r/g EXTREMITIES normal strength, tone, and muscle mass PLAN OF CARE Provide calm/safe environment. Vital signs assessed twice daily. ED BHU Assessment once each 12-hour shift. Collaborate with intake RN daily or as condition indicates. Assure the ED provider has rounded once each shift. Provide and encourage hygiene. Provide redirection as needed. Assess for escalating behavior; address immediately and inform ED provider.  Assess family dynamic and appropriateness for visitation as needed: Yes.  ; If necessary, describe findings:  Educate the patient/family about BHU procedures/visitation: Yes.  ; If necessary, describe findings:  

## 2014-11-07 NOTE — ED Notes (Signed)
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
## 2014-11-07 NOTE — ED Notes (Signed)
Pt. Stated "I need something to help me sleep".  Told pt. I would ask ED Dr. For something to help him sleep.

## 2014-11-08 DIAGNOSIS — F918 Other conduct disorders: Secondary | ICD-10-CM | POA: Diagnosis not present

## 2014-11-08 MED ORDER — NICOTINE 21 MG/24HR TD PT24
21.0000 mg | MEDICATED_PATCH | Freq: Every day | TRANSDERMAL | Status: DC
  Administered 2014-11-08 – 2014-11-10 (×3): 21 mg via TRANSDERMAL
  Filled 2014-11-08 (×2): qty 1

## 2014-11-08 MED ORDER — NICOTINE 21 MG/24HR TD PT24
MEDICATED_PATCH | TRANSDERMAL | Status: AC
  Filled 2014-11-08: qty 1

## 2014-11-08 NOTE — ED Notes (Signed)
Patient in dayroom watching television. Cooperative with all nursing interventions. In no apparent distress. Maintained on 15 minute checks and observation by security camera for safety.

## 2014-11-08 NOTE — ED Notes (Signed)
Patient resting quietly in room. No noted distress or abnormal behaviors noted. Will continue 15 minute checks and observation by security camera for safety. 

## 2014-11-08 NOTE — ED Provider Notes (Signed)
-----------------------------------------   7:11 AM on 11/08/2014 -----------------------------------------   Blood pressure 126/67, pulse 56, temperature 97.8 F (36.6 C), temperature source Oral, resp. rate 18, height 5' 9.5" (1.765 m), weight 150 lb (68.04 kg), SpO2 100 %.  The patient had no acute events since last update.  Calm and cooperative at this time.  Disposition is pending per Psychiatry/Behavioral Medicine team recommendations.     Irean Hong, MD 11/08/14 [phone]

## 2014-11-08 NOTE — ED Notes (Signed)
Pt. Noted in room resting quietly;. No complaints or concerns voiced. No distress or abnormal behavior noted. Will continue to monitor with security cameras. Q 15 minute rounds continue. 

## 2014-11-08 NOTE — ED Notes (Signed)
BEHAVIORAL HEALTH ROUNDING  Patient sleeping: Yes.  Patient alert and oriented: no  Behavior appropriate: Yes. ; If no, describe:  Nutrition and fluids offered: No  Toileting and hygiene offered: No  Sitter present: no  Law enforcement present: Yes   

## 2014-11-08 NOTE — Progress Notes (Signed)
CSW Spoke to [employer]- She has to go to Bank- She will also bring pts personal effects tomorrow morning to ED and the monies that was deposited into account. She was very hard to understand and her phone was fading in and out. She is not able to come at all today.

## 2014-11-08 NOTE — ED Notes (Signed)
BEHAVIORAL HEALTH ROUNDING Patient sleeping: Yes.   Patient alert and oriented: not applicable SLEEPING Behavior appropriate: Yes.  ; If no, describe: SLEEPING Nutrition and fluids offered: No SLEEPING Toileting and hygiene offered: NoSLEEPING Sitter present: not applicable Law enforcement present: Yes ODS 

## 2014-11-08 NOTE — ED Notes (Signed)
Meal given. Patient stable. Continue safety checks. 

## 2014-11-08 NOTE — ED Notes (Signed)
ivc /pending psych consult /moved to bhu

## 2014-11-08 NOTE — ED Notes (Addendum)
Pt. Noted in room. No complaints or concerns voiced. No distress or abnormal behavior noted. Will continue to monitor with security cameras. Q 15 minute rounds continue. 

## 2014-11-08 NOTE — ED Notes (Signed)
BEHAVIORAL HEALTH ROUNDING  Patient sleeping: No.  Patient alert and oriented: yes  Behavior appropriate: Yes. ; If no, describe:  Nutrition and fluids offered: Yes  Toileting and hygiene offered: Yes  Sitter present: not applicable  Law enforcement present: Yes ODS  

## 2014-11-08 NOTE — ED Notes (Signed)
Patient spoke with his brother via telephone. In NAD. Maintained on 15 minute checks and observation by security camera for safety.

## 2014-11-08 NOTE — ED Notes (Signed)

## 2014-11-09 DIAGNOSIS — F250 Schizoaffective disorder, bipolar type: Secondary | ICD-10-CM | POA: Diagnosis not present

## 2014-11-09 DIAGNOSIS — F918 Other conduct disorders: Secondary | ICD-10-CM | POA: Diagnosis not present

## 2014-11-09 MED ORDER — DIVALPROEX SODIUM 500 MG PO DR TAB: 500.0000 mg | DELAYED_RELEASE_TABLET | Freq: Two times a day (BID) | ORAL | Status: DC

## 2014-11-09 MED ORDER — LEVOTHYROXINE SODIUM 125 MCG PO TABS: 125.0000 ug | ORAL_TABLET | Freq: Every day | ORAL | Status: DC

## 2014-11-09 MED ORDER — OXCARBAZEPINE 600 MG PO TABS: 600.0000 mg | ORAL_TABLET | Freq: Two times a day (BID) | ORAL | Status: DC

## 2014-11-09 MED ORDER — MIRTAZAPINE 30 MG PO TABS: 30.0000 mg | ORAL_TABLET | Freq: Every day | ORAL | Status: DC

## 2014-11-09 MED ORDER — HYDROXYZINE HCL 50 MG PO TABS: 50.0000 mg | ORAL_TABLET | Freq: Three times a day (TID) | ORAL | Status: DC | PRN

## 2014-11-09 MED ORDER — PALIPERIDONE ER 6 MG PO TB24: 6.0000 mg | ORAL_TABLET | Freq: Every day | ORAL | Status: DC

## 2014-11-09 NOTE — ED Provider Notes (Signed)
-----------------------------------------   2:43 PM on 11/09/2014 -----------------------------------------   Blood pressure 114/70, pulse 86, temperature 98.1 F (36.7 C), temperature source Oral, resp. rate 18, height 5' 9.5" (1.765 m), weight 150 lb (68.04 kg), SpO2 99 %.  The patient had no acute events since last update.  Calm and cooperative at this time.  Cleared by psychiatry for discharge to home. Not suicidal or homicidal at this time. We'll go home with his brother. To follow-up with his case manager.   Myrna Blazer, MD 11/09/14 [phone]

## 2014-11-09 NOTE — ED Notes (Signed)
Pt. Noted in  room watching the tv.;. No complaints or concerns voiced. No distress or abnormal behavior noted. Will continue to monitor with security cameras. Q 15 minute rounds continue. 

## 2014-11-09 NOTE — ED Notes (Signed)
Pt. Noted in room resting quietly. No complaints or concerns voiced. No distress or abnormal behavior noted. Will continue to monitor with security cameras. Q 15 minute rounds continue.Pt. Noted in room. No complaints or concerns voiced. No distress or abnormal behavior noted. Will continue to monitor with security cameras. Q 15 minute rounds continue.

## 2014-11-09 NOTE — ED Notes (Signed)
Snack and beverage given. 

## 2014-11-09 NOTE — ED Notes (Signed)
Pt. Noted in room resting quietly;. No complaints or concerns voiced. No distress or abnormal behavior noted. Will continue to monitor with security cameras. Q 15 minute rounds continue. 

## 2014-11-09 NOTE — Progress Notes (Signed)
LCSW called [name]- She has agreed to come to [place] and drop off patients personal effects and some monies owed to pts brother.  Called [name] he is working on truck delivery now and will be only able to pick up his brother early evening.  Called Care Cordinator Delores Human [phone] and left message for her to call us back
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
## 2014-11-09 NOTE — ED Notes (Signed)
BEHAVIORAL HEALTH ROUNDING Patient sleeping: Yes.   Patient alert and oriented: not applicable SLEEPING Behavior appropriate: Yes.  ; If no, describe: SLEEPING Nutrition and fluids offered: No SLEEPING Toileting and hygiene offered: NoSLEEPING Sitter present: not applicable Law enforcement present: Yes ODS 

## 2014-11-09 NOTE — ED Notes (Signed)
BEHAVIORAL HEALTH ROUNDING Patient sleeping: Yes.   Patient alert and oriented: not applicable Behavior appropriate: Yes.  ; If no, describe:  Nutrition and fluids offered: Yes  Toileting and hygiene offered: Yes  Sitter present: yes Law enforcement present: Yes  

## 2014-11-09 NOTE — Discharge Instructions (Signed)
Schizophrenia °Schizophrenia is a mental illness. It may cause disturbed or disorganized thinking, speech, or behavior. People with schizophrenia have problems functioning in one or more areas of life: work, school, home, or relationships. People with schizophrenia are at increased risk for suicide, certain chronic physical illnesses, and unhealthy behaviors, such as smoking and drug use. °People who have family members with schizophrenia are at higher risk of developing the illness. Schizophrenia affects men and women equally but usually appears at an earlier age (teenage or early adult years) in men.  °SYMPTOMS °The earliest symptoms are often subtle (prodrome) and may go unnoticed until the illness becomes more severe (first-break psychosis). Symptoms of schizophrenia may be continuous or may come and go in severity. Episodes often are triggered by major life events, such as family stress, college, military service, marriage, pregnancy or child birth, divorce, or loss of a loved one. People with schizophrenia may see, hear, or feel things that do not exist (hallucinations). They may have false beliefs in spite of obvious proof to the contrary (delusions). Sometimes speech is incoherent or behavior is odd or withdrawn.  °DIAGNOSIS °Schizophrenia is diagnosed through an assessment by your caregiver. Your caregiver will ask questions about your thoughts, behavior, mood, and ability to function in daily life. Your caregiver may ask questions about your medical history and use of alcohol or drugs, including prescription medication. Your caregiver may also order blood tests and imaging exams. Certain medical conditions and substances can cause symptoms that resemble schizophrenia. Your caregiver may refer you to a mental health specialist for evaluation. There are three major criterion for a diagnosis of schizophrenia: °· Two or more of the following five symptoms are present for a month or longer: °¨ Delusions. Often  the delusions are that you are being attacked, harassed, cheated, persecuted or conspired against (persecutory delusions). °¨ Hallucinations.   °¨ Disorganized speech that does not make sense to others. °¨ Grossly disorganized (confused or unfocused) behavior or extremely overactive or underactive motor activity (catatonia). °¨ Negative symptoms such as bland or blunted emotions (flat affect), loss of will power (avolition), and withdrawal from social contacts (social isolation). °· Level of functioning in one or more major areas of life (work, school, relationships, or self-care) is markedly below the level of functioning before the onset of illness.   °· There are continuous signs of illness (either mild symptoms or decreased level of functioning) for at least 6 months or longer. °TREATMENT  °Schizophrenia is a long-term illness. It is best controlled with continuous treatment rather than treatment only when symptoms occur. The following treatments are used to manage schizophrenia: °· Medication--Medication is the most effective and important form of treatment for schizophrenia. Antipsychotic medications are usually prescribed to help manage schizophrenia. Other types of medication may be added to relieve any symptoms that may occur despite the use of antipsychotic medications. °· Counseling or talk therapy--Individual, group, or family counseling may be helpful in providing education, support, and guidance. Many people with schizophrenia also benefit from social skills and job skills (vocational) training. °A combination of medication and counseling is best for managing the disorder over time. A procedure in which electricity is applied to the brain through the scalp (electroconvulsive therapy) may be used to treat catatonic schizophrenia or schizophrenia in people who cannot take or do not respond to medication and counseling. °  °This information is not intended to replace advice given to you by your health  care provider. Make sure you discuss any questions you have with   your health care provider. °  °Document Released: 01/14/2000 Document Revised: 02/06/2014 Document Reviewed: 04/10/2012 °Elsevier Interactive Patient Education ©2016 Else[employer] Inc[employer] ° °

## 2014-11-09 NOTE — ED Notes (Signed)
BEHAVIORAL HEALTH ROUNDING Patient sleeping: Yes.   Patient alert and oriented: not applicable Behavior appropriate: Yes.  ; If no, describe:  Nutrition and fluids offered: No Toileting and hygiene offered: No Sitter present: yes Law enforcement present: Yes   

## 2014-11-09 NOTE — Progress Notes (Signed)
[name] was called LCSW sternly reminded her that 2 hours have passed and pt personal effects not available or brought here. LCSW will meet with her in ED  Patient has been very emotional and demanded to speak to his brother. Patients brother is working and will only be able to pick him up in the evening. [name]  [phone]  Disposition Note: Pt will reside with his brother in [place] and follow up with [facility] ( [facility] [place] [phone]

## 2014-11-09 NOTE — ED Provider Notes (Signed)
-----------------------------------------   7:47 AM on 11/09/2014 -----------------------------------------   Blood pressure 117/57, pulse 66, temperature 97.7 F (36.5 C), temperature source Oral, resp. rate 18, height 5' 9.5" (1.765 m), weight 150 lb (68.04 kg), SpO2 99 %.  The patient had no acute events since last update.  Calm and cooperative at this time.  Disposition is pending per Psychiatry/Behavioral Medicine team recommendations.     Myrna Blazer, MD 11/09/14 [phone]

## 2014-11-09 NOTE — ED Notes (Signed)
BEHAVIORAL HEALTH ROUNDING Patient sleeping: No. Patient alert and oriented: yes Behavior appropriate: Yes.  ; If no, describe:  Nutrition and fluids offered: Yes  Toileting and hygiene offered: Yes  Sitter present: yes Law enforcement present: Yes  

## 2014-11-09 NOTE — ED Notes (Signed)
BEHAVIORAL HEALTH ROUNDING Patient sleeping: Yes.   Patient alert and oriented: yes Behavior appropriate: Yes.  ; If no, describe:  Nutrition and fluids offered: Yes  Toileting and hygiene offered: Yes  Sitter present: yes Law enforcement present: Yes  

## 2014-11-09 NOTE — Consult Note (Signed)
[facility] Face-to-Face Psychiatry Consult   Reason for Consult:  Consult for this 49 year old man with a history of schizoaffective disorder and traumatic brain injury who presents under commitment from his group home Referring Physician:  Derrill Kay Patient Identification: Fredderick Swanger MRN:  [id number] Principal Diagnosis: Schizoaffective disorder, bipolar type [facility]) Diagnosis:   Patient Active Problem List   Diagnosis Date Noted  . Tobacco use disorder [F17.200] 10/20/2014  . Schizoaffective disorder, bipolar type (HCC) [F25.0] 10/19/2014  . Traumatic brain injury [facility]) [S06.9X9A] 10/19/2014    Total Time spent with patient: 45 minutes  Subjective:   Suhayb Anzalone is a 49 y.o. male patient admitted with "I am going to get some money from those people".  HPI:  Patient was brought in under petition filed by his group home which states that he has threatened to burn down the group home again. This is the same threat that he apparently makes on a pretty regular basis. Patient tells me that he made no such threat. He says that he had no thoughts about actually hurting anybody or killing anyone. He focuses on his dissatisfaction with the finances at the group home. He believes that the group home is obliged to give him his money more quickly than they are doing. He also has the belief that they cheated him out of money for a couple of years. He says he intends to try and deal with this by suing them. He says his mood has been generally good. Sleep is been a little bit poor but no different than usual. No new physical complaints. He says he has been compliant with medication. He doesn't identify anything except the finances as being an acute stress. This is the week that the checks come in and that seems to probably set him off. Denies any suicidal ideation.  Past psychiatric history: Patient has had multiple visits to the emergency room and at least 1 hospitalization. Similar setting every time. He gets  into arguments at the group home over some perceived dissatisfaction and then allegedly makes threats. Doesn't seem that he's actually hurt anyone. No known history of suicide attempts.  Family history: Patient denies that there is any family history of mental illness or substance abuse.  Social history: Patient is residing at a local group home. Family don't seem like they're very involved. The conflict with the group home over various things that he is dissatisfied with.  Medical history: There is a history of a traumatic brain injury as a young man which is probably related to his impulsivity and concrete thinking.  Past Psychiatric History: Past history of several presentations to the emergency room. He was hospitalized earlier in the summer. No known history of actual serious violence or suicide attempts. Diagnosis of schizoaffective disorder and traumatic brain injury. Currently managed on psychiatric medication.  Follow-up for this 49 year old man with schizoaffective disorder and traumatic brain injury. Patient has not been violent threatening or agitated here in the emergency room. He has mostly stayed to himself and has behaved appropriately. Does not appear to be acutely psychotic. He does appear to have some chronic cognitive problems. The group home where he had been living is declining to take him back at this point and the patient is satisfied with that as he has been wanting to get away from there anyway. Fortunately his brother has volunteered to take care of him instead. Her other will be coming to pick the patient up. Patient is being given prescriptions and will follow-up with his  case Production designer, theatre/television/film.  Risk to Self: Suicidal Ideation: No Suicidal Intent: No Is patient at risk for suicide?: No Suicidal Plan?: No Access to Means: No What has been your use of drugs/alcohol within the last 12 months?: None reported How many times?: 0 Other Self Harm Risks: None reported Triggers for Past  Attempts: None known Intentional Self Injurious Behavior: None Risk to Others: Homicidal Ideation: Yes-Currently Present Thoughts of Harm to Others: Yes-Currently Present Comment - Thoughts of Harm to Others: Pt reports wanting to burn group home down. Current Homicidal Intent: Yes-Currently Present Current Homicidal Plan: Yes-Currently Present Describe Current Homicidal Plan: Pt reports plan to burn group home down. Access to Homicidal Means: No Identified Victim: group home History of harm to others?: No Assessment of Violence: None Noted Violent Behavior Description: None Does patient have access to weapons?: No Criminal Charges Pending?: No Does patient have a court date: No Prior Inpatient Therapy: Prior Inpatient Therapy: Yes Prior Therapy Dates: 10/29/2014 Prior Therapy Facilty/Provider(s): [facility] Reason for Treatment: homicidal Prior Outpatient Therapy: Prior Outpatient Therapy: No Prior Therapy Dates: N/A Prior Therapy Facilty/Provider(s): N/A Reason for Treatment: N/A Does patient have an ACCT team?: No Does patient have Intensive In-House Services?  : No Does patient have [facility] services? : No Does patient have P4[facility] services?: No  Past Medical History:  Past Medical History  Diagnosis Date  . Schizoaffective disorder (HCC)   . Traumatic brain injury (HCC)   . Heart murmur   . Headache    History reviewed. No pertinent past surgical history. Family History:  Family History  Problem Relation Age of Onset  . Schizophrenia Father    Family Psychiatric  History: He denies there is any family history of psychiatric illness. Social History:  History  Alcohol Use No     History  Drug Use No    Comment: Patient reports past use    Social History   Social History  . Marital Status: Divorced    Spouse Name: N/A  . Number of Children: N/A  . Years of Education: N/A   Social History Main Topics  . Smoking status: Current Every Day Smoker -- 2.00 packs/day for  39 years    Types: Cigarettes  . Smokeless tobacco: Never Used  . Alcohol Use: No  . Drug Use: No     Comment: Patient reports past use  . Sexual Activity: Yes    Birth Control/ Protection: None   Other Topics Concern  . None   Social History Narrative   Additional Social History:    History of alcohol / drug use?: Yes (Pt reports he drinks beer occassionally.)                     Allergies:  No Known Allergies  Labs:  No results found for this or any previous visit (from the past 48 hour(s)).  Current Facility-Administered Medications  Medication Dose Route Frequency Provider Last Rate Last Dose  . divalproex (DEPAKOTE) DR tablet 500 mg  500 mg Oral Q12H Rockne Menghini, MD   500 mg at 11/09/14 0810  . levothyroxine (SYNTHROID, LEVOTHROID) tablet 125 mcg  125 mcg Oral QAC breakfast Rockne Menghini, MD   125 mcg at 11/09/14 0811  . mirtazapine (REMERON) tablet 30 mg  30 mg Oral QHS Rockne Menghini, MD   30 mg at 11/08/14 2052  . nicotine (NICODERM CQ - dosed in mg/24 hours) patch 21 mg  21 mg Transdermal Daily Arnaldo Natal, MD   21  mg at 11/09/14 0812  . Oxcarbazepine (TRILEPTAL) tablet 600 mg  600 mg Oral BID Rockne Menghini, MD   600 mg at 11/09/14 0811  . paliperidone (INVEGA) 24 hr tablet 6 mg  6 mg Oral Daily Rockne Menghini, MD   6 mg at 11/09/14 0981   Current Outpatient Prescriptions  Medication Sig Dispense Refill  . divalproex (DEPAKOTE) 500 MG DR tablet Take 1 tablet (500 mg total) by mouth every 12 (twelve) hours. 60 tablet 0  . divalproex (DEPAKOTE) 500 MG DR tablet Take 1 tablet (500 mg total) by mouth 2 (two) times daily. 60 tablet 0  . hydrOXYzine (ATARAX/VISTARIL) 50 MG tablet Take 1 tablet (50 mg total) by mouth 3 (three) times daily as needed. 90 tablet 0  . levothyroxine (SYNTHROID, LEVOTHROID) 125 MCG tablet Take 125 mcg by mouth daily before breakfast.    . levothyroxine (SYNTHROID, LEVOTHROID) 125 MCG tablet Take 1  tablet (125 mcg total) by mouth daily before breakfast. 30 tablet 0  . mirtazapine (REMERON) 30 MG tablet Take 1 tablet (30 mg total) by mouth at bedtime. 30 tablet 0  . mirtazapine (REMERON) 30 MG tablet Take 1 tablet (30 mg total) by mouth at bedtime. 30 tablet 0  . oxcarbazepine (TRILEPTAL) 600 MG tablet Take 1 tablet (600 mg total) by mouth 2 (two) times daily. 60 tablet 0  . Oxcarbazepine (TRILEPTAL) 600 MG tablet Take 1 tablet (600 mg total) by mouth 2 (two) times daily. 60 tablet 0  . paliperidone (INVEGA) 6 MG 24 hr tablet Take 1 tablet (6 mg total) by mouth daily. 30 tablet 0  . paliperidone (INVEGA) 6 MG 24 hr tablet Take 1 tablet (6 mg total) by mouth daily. 30 tablet 0    Musculoskeletal: Strength & Muscle Tone: within normal limits Gait & Station: normal Patient leans: N/A  Psychiatric Specialty Exam: Review of Systems  Constitutional: Negative.   HENT: Negative.   Eyes: Negative.   Respiratory: Negative.   Cardiovascular: Negative.   Gastrointestinal: Negative.   Musculoskeletal: Negative.   Skin: Negative.   Neurological: Negative.   Psychiatric/Behavioral: Negative for depression, suicidal ideas, hallucinations, memory loss and substance abuse. The patient is not nervous/anxious and does not have insomnia.     Blood pressure 114/70, pulse 86, temperature 98.1 F (36.7 C), temperature source Oral, resp. rate 18, height 5' 9.5" (1.765 m), weight 68.04 kg (150 lb), SpO2 99 %.Body mass index is 21.84 kg/(m^2).  General Appearance: Casual  Eye Contact::  Good  Speech:  Clear and Coherent  Volume:  Normal  Mood:  Irritable  Affect:  Full Range  Thought Process:  Tangential  Orientation:  Full (Time, Place, and Person)  Thought Content:  Negative  Suicidal Thoughts:  No  Homicidal Thoughts:  No  Memory:  Immediate;   Fair Recent;   Fair Remote;   Fair  Judgement:  Impaired  Insight:  Shallow  Psychomotor Activity:  Normal  Concentration:  Fair  Recall:  [employer] of Knowledge:Fair  Language: Fair  Akathisia:  No  Handed:  Right  AIMS (if indicated):     Assets:  Financial Resources/Insurance Housing Physical Health Resilience  ADL's:  Intact  Cognition: Impaired,  Mild  Sleep:      Treatment Plan Summary: Plan Patient will be discharged from the emergency room. He is not threatening or agitated currently. Prescriptions written. Case reviewed with emergency room doctor. Vital signs reviewed. No longer meets commitment criteria. Patient will follow-up with mental health  locally with the assistance of his case manager.  Disposition: Patient does not meet criteria for psychiatric inpatient admission. Supportive therapy provided about ongoing stressors.  Daniqua Campoy 11/09/2014 3:53 PM

## 2014-11-09 NOTE — ED Notes (Signed)

## 2014-11-09 NOTE — ED Notes (Signed)
Report received from Gwendolyn Fill. RN. Pt. Alert and oriented in no distress denies SI, HI, AVH and pain.  Pt. Instructed to come to me with problems or concerns.Will continue to monitor for safety via security cameras and Q 15 minute checks.

## 2014-11-09 NOTE — ED Notes (Signed)
Pt. Noted in room watching tv. No complaints or concerns voiced. No distress or abnormal behavior noted. Will continue to monitor with security cameras. Q 15 minute rounds continue. 

## 2014-11-09 NOTE — ED Notes (Addendum)
BEHAVIORAL HEALTH ROUNDING Patient sleeping: Yes.   Patient alert and oriented: not applicable SLEEPING Behavior appropriate: Yes.  ; If no, describe: SLEEPING Nutrition and fluids offered: No SLEEPING Toileting and hygiene offered: NoSLEEPING Sitter present: not applicable Law enforcement present: Yes ODS 

## 2014-11-10 DIAGNOSIS — F918 Other conduct disorders: Secondary | ICD-10-CM | POA: Diagnosis not present

## 2014-11-10 NOTE — ED Notes (Signed)
Client is awake and up to the bathroom.

## 2014-11-10 NOTE — ED Notes (Signed)
Patient irritable, upset with continued stay. Maintained on 15 minute checks and observation by security camera for safety.

## 2014-11-10 NOTE — ED Notes (Signed)
Pt. Noted in room resting quietly;. No complaints or concerns voiced. No distress or abnormal behavior noted. Will continue to monitor with security cameras. Q 15 minute rounds continue. 

## 2014-11-10 NOTE — ED Notes (Signed)
Call placed to [facility] - will pick up patient from ED lobby.

## 2014-11-10 NOTE — ED Notes (Signed)
Patient wants to talk with SW. Call placed Claudine who has been working with patient on placement. She states she will see what she can do to expedite this issue.

## 2014-11-10 NOTE — ED Provider Notes (Signed)
-----------------------------------------   7:08 AM on 11/10/2014 -----------------------------------------   Blood pressure 115/66, pulse 86, temperature 97.7 F (36.5 C), temperature source Oral, resp. rate 18, height 5' 9.5" (1.765 m), weight 150 lb (68.04 kg), SpO2 100 %.  The patient had no acute events since last update.  Calm and cooperative at this time.  Disposition is pending per Psychiatry/Behavioral Medicine team recommendations.   Plan for discharge to brother today.  Rebecka Apley, MD 11/10/14 [phone]

## 2014-11-10 NOTE — ED Notes (Signed)
Patient discharged ambulatory to shelter in [place]. Transportation provided by [employer]. Patient denies SI or HI. Discharge instructions reviewed with patient, he verbalizes understanding. Patient received copy of DC plan, prescriptions, a list of resources, and all personal belongings.

## 2014-11-10 NOTE — Progress Notes (Signed)
LCSW paged Dr Weber Cooks to consult about current situation. Once cleared patient is to proceed by cab to [facility]. LCSW met with patient and provided community resource list, and discussed the services of [facility] that he could access once discharged. One link transport bus ticket provided for [place] follow up at [facility]. In discussion patient stated that he is not homicidal or suicidal ever. He was explained the many resources he can access once at [facility] and that a [facility] representative is at the shelter 3x week and he can talk about supports if he feels he needs it.  Informed staff of discharge plan

## 2014-11-10 NOTE — ED Notes (Signed)

## 2014-11-10 NOTE — ED Notes (Signed)
Patient awake, took shower. Maintained on 15 minute checks and observation by security camera for safety.

## 2015-01-02 ENCOUNTER — Other Ambulatory Visit: Payer: Self-pay | Admitting: Psychiatry

## 2017-03-12 DIAGNOSIS — R0602 Shortness of breath: Secondary | ICD-10-CM | POA: Insufficient documentation

## 2017-03-12 DIAGNOSIS — M8949 Other hypertrophic osteoarthropathy, multiple sites: Secondary | ICD-10-CM | POA: Insufficient documentation

## 2017-03-12 DIAGNOSIS — M159 Polyosteoarthritis, unspecified: Secondary | ICD-10-CM | POA: Insufficient documentation

## 2020-01-26 ENCOUNTER — Emergency Department (HOSPITAL_COMMUNITY)
Admission: EM | Admit: 2020-01-26 | Discharge: 2020-01-26 | Disposition: A | Discharge status: Home / Self Care | Payer: Medicare Other | Attending: Emergency Medicine | Admitting: Emergency Medicine

## 2020-01-26 ENCOUNTER — Emergency Department (HOSPITAL_COMMUNITY): Payer: Medicare Other

## 2020-01-26 ENCOUNTER — Other Ambulatory Visit: Payer: Self-pay

## 2020-01-26 ENCOUNTER — Encounter (HOSPITAL_COMMUNITY): Payer: Self-pay | Admitting: Emergency Medicine

## 2020-01-26 DIAGNOSIS — F17210 Nicotine dependence, cigarettes, uncomplicated: Secondary | ICD-10-CM | POA: Diagnosis not present

## 2020-01-26 DIAGNOSIS — Z79899 Other long term (current) drug therapy: Secondary | ICD-10-CM | POA: Insufficient documentation

## 2020-01-26 DIAGNOSIS — M25561 Pain in right knee: Secondary | ICD-10-CM | POA: Diagnosis not present

## 2020-01-26 MED ORDER — ACETAMINOPHEN 325 MG PO TABS
650.0000 mg | ORAL_TABLET | Freq: Once | ORAL | Status: AC
  Administered 2020-01-26: 650 mg via ORAL
  Filled 2020-01-26: qty 2

## 2020-01-26 MED ORDER — IBUPROFEN 400 MG PO TABS
600.0000 mg | ORAL_TABLET | Freq: Once | ORAL | Status: AC
  Administered 2020-01-26: 600 mg via ORAL
  Filled 2020-01-26: qty 2

## 2020-01-26 NOTE — ED Triage Notes (Signed)
Pt to the ED [facility] from [facility], c/o right knee pain x1 wk.

## 2020-01-26 NOTE — Discharge Instructions (Addendum)
Xray or your knee shows you have bony spurs in your knee.  This could be causing your pain.  If your knee swells you should elevate it and wear the Ace wrap.  Continue to take ibuprofen and Tylenol for pain.  Take as directed on the bottles.  Follow-up with the on-call orthopedist Dr. Dallas Schimke for further evaluation of your pain.

## 2020-01-26 NOTE — ED Provider Notes (Signed)
[facility] EMERGENCY DEPARTMENT Provider Note   CSN: [id number] Arrival date & time: 01/26/20  1129     History Chief Complaint  Patient presents with  . Knee Pain    Jaime Vargas is a 54 y.o. male with past medical history significant for traumatic brain injury, schizoaffective disorder, heart murmur.  HPI Patient presents to emergency room today via EMS from [facility] care home with chief complaint of right knee pain x1 week. Pain is intermittent. He describes pain as an aching sensation in his knee. Does not radiate. Endorses intermittent swelling as well. Pain 8/10 in severity. Has been taking advil at home, last dose was yesterday. Unsure if it is helping his pain. He states pain is worse with walking. Denies recent injury or fall.  He denies any fever, chills, weakness, numbness,      Past Medical History:  Diagnosis Date  . Headache   . Heart murmur   . Schizoaffective disorder (HCC)   . Traumatic brain injury [facility])     Patient Active Problem List   Diagnosis Date Noted  . Tobacco use disorder 10/20/2014  . Schizoaffective disorder, bipolar type (HCC) 10/19/2014  . Traumatic brain injury (HCC) 10/19/2014    History reviewed. No pertinent surgical history.     Family History  Problem Relation Age of Onset  . Schizophrenia Father     Social History   Tobacco Use  . Smoking status: Current Every Day Smoker    Packs/day: 1.00    Years: 39.00    Pack years: 39.00    Types: Cigarettes  . Smokeless tobacco: Never Used  Vaping Use  . Vaping Use: Never used  Substance Use Topics  . Alcohol use: No  . Drug use: No    Comment: Patient reports past use    Home Medications Prior to Admission medications   Medication Sig Start Date End Date Taking? Authorizing Provider  divalproex (DEPAKOTE) 500 MG DR tablet Take 1 tablet (500 mg total) by mouth 2 (two) times daily. 11/09/14   Clapacs, Jackquline Denmark, MD  hydrOXYzine (ATARAX/VISTARIL) 50 MG tablet Take 1 tablet  (50 mg total) by mouth 3 (three) times daily as needed. 11/09/14   Clapacs, Jackquline Denmark, MD  levothyroxine (SYNTHROID, LEVOTHROID) 125 MCG tablet Take 1 tablet (125 mcg total) by mouth daily before breakfast. 11/09/14   Clapacs, Jackquline Denmark, MD  mirtazapine (REMERON) 30 MG tablet Take 1 tablet (30 mg total) by mouth at bedtime. 11/09/14   Clapacs, Jackquline Denmark, MD  Oxcarbazepine (TRILEPTAL) 600 MG tablet Take 1 tablet (600 mg total) by mouth 2 (two) times daily. 11/09/14   Clapacs, Jackquline Denmark, MD  paliperidone (INVEGA) 6 MG 24 hr tablet Take 1 tablet (6 mg total) by mouth daily. 11/09/14   Clapacs, Jackquline Denmark, MD    Allergies    Patient has no known allergies.  Review of Systems   Review of Systems All other systems are reviewed and are negative for acute change except as noted in the HPI.  Physical Exam Updated Vital Signs BP 103/76 (BP Location: Right Arm)   Pulse 66   Temp 97.8 F (36.6 C) (Oral)   Resp 16   Ht 5\' 9"  (1.753 m)   Wt 65.8 kg   SpO2 100%   BMI 21.41 kg/m   Physical Exam Vitals and nursing note reviewed.  Constitutional:      Appearance: He is well-developed. He is not ill-appearing or toxic-appearing.  HENT:     Head: Normocephalic and  atraumatic.     Nose: Nose normal.  Eyes:     General: No scleral icterus.       Right eye: No discharge.        Left eye: No discharge.     Conjunctiva/sclera: Conjunctivae normal.  Neck:     Vascular: No JVD.  Cardiovascular:     Rate and Rhythm: Normal rate and regular rhythm.     Pulses: Normal pulses.          Dorsalis pedis pulses are 2+ on the right side and 2+ on the left side.     Heart sounds: Normal heart sounds.  Pulmonary:     Effort: Pulmonary effort is normal.     Breath sounds: Normal breath sounds.  Abdominal:     General: There is no distension.  Musculoskeletal:        General: Normal range of motion.     Cervical back: Normal range of motion.     Comments: Ambulatory with normal gait.  Homans sign absent bilaterally,  no lower extremity edema, no palpable cords, compartments are soft  Bony tenderness to palpation of right knee. No deformity. No crepitus. Neurovascularly intact distally. Compartments soft above and below affected joint.    Skin:    General: Skin is warm and dry.     Comments: Equal tactile temperature in all extremities.  Neurological:     Mental Status: He is oriented to person, place, and time.     GCS: GCS eye subscore is 4. GCS verbal subscore is 5. GCS motor subscore is 6.     Comments: Fluent speech, no facial droop.  Psychiatric:        Behavior: Behavior normal.     ED Results / Procedures / Treatments   Labs (all labs ordered are listed, but only abnormal results are displayed) Labs Reviewed - No data to display  EKG None  Radiology DG Knee Complete 4 Views Right  Result Date: 01/26/2020 CLINICAL DATA:  Pain and swelling over the last week. EXAM: RIGHT KNEE - COMPLETE 4+ VIEW COMPARISON:  None. FINDINGS: No visible joint effusion. Medial and lateral weight-bearing compartments show normal height. There are small medial and lateral compartment marginal osteophytes. No evidence of fracture or focal bone lesion. IMPRESSION: Small medial and lateral compartment marginal osteophytes. Otherwise normal. No joint effusion. No visible bone lesion or fracture. Electronically Signed   By: Paulina Fusi M.D.   On: 01/26/2020 15:16    Procedures Procedures (including critical care time)  Medications Ordered in ED Medications  ibuprofen (ADVIL) tablet 600 mg (has no administration in time range)  acetaminophen (TYLENOL) tablet 650 mg (has no administration in time range)    ED Course  I have reviewed the triage vital signs and the nursing notes.  Pertinent labs & imaging results that were available during my care of the patient were reviewed by me and considered in my medical decision making (see chart for details).    MDM Rules/Calculators/A&P                           History provided by patient with additional history obtained from chart review.    Patient presents to the ED with complaints of pain to the right knee pain without known injury. Exam without obvious deformity or open wounds. ROM intact. Bony tenderness on exam, no crepitus. NVI distally. Exam not suggestive of septic joint or gout. Xray viewed by me shows  negative for fracture/dislocation, does have small medial and lateral osteophytes He is ambulatory with normal gait. Patient given ibuprofen and tylenol for pain. Advised to take the same at home. I discussed results, treatment plan, need for ortho follow-up, and return precautions with the patient. Provided opportunity for questions, patient confirmed understanding and are in agreement with plan.    Portions of this note were generated with Scientist, clinical (histocompatibility and immunogenetics). Dictation errors may occur despite best attempts at proofreading.   Final Clinical Impression(s) / ED Diagnoses Final diagnoses:  Acute pain of right knee    Rx / DC Orders ED Discharge Orders    None       Kandice Hams 01/26/20 1649    Terrilee Files, MD 01/27/20 1008

## 2020-07-01 ENCOUNTER — Other Ambulatory Visit: Payer: Self-pay

## 2020-07-01 ENCOUNTER — Emergency Department: Payer: Medicare Other

## 2020-07-01 ENCOUNTER — Encounter: Payer: Self-pay | Admitting: Emergency Medicine

## 2020-07-01 ENCOUNTER — Inpatient Hospital Stay
Admission: EM | Admit: 2020-07-01 | Discharge: 2020-07-07 | DRG: 391 | Disposition: A | Discharge status: Home / Self Care | Payer: Medicare Other | Attending: Student | Admitting: Student

## 2020-07-01 DIAGNOSIS — I700 Atherosclerosis of aorta: Secondary | ICD-10-CM | POA: Diagnosis present

## 2020-07-01 DIAGNOSIS — Z20822 Contact with and (suspected) exposure to covid-19: Secondary | ICD-10-CM | POA: Diagnosis present

## 2020-07-01 DIAGNOSIS — K269 Duodenal ulcer, unspecified as acute or chronic, without hemorrhage or perforation: Secondary | ICD-10-CM | POA: Diagnosis present

## 2020-07-01 DIAGNOSIS — Z87820 Personal history of traumatic brain injury: Secondary | ICD-10-CM | POA: Diagnosis not present

## 2020-07-01 DIAGNOSIS — Z681 Body mass index (BMI) 19 or less, adult: Secondary | ICD-10-CM

## 2020-07-01 DIAGNOSIS — E43 Unspecified severe protein-calorie malnutrition: Secondary | ICD-10-CM | POA: Diagnosis present

## 2020-07-01 DIAGNOSIS — Q631 Lobulated, fused and horseshoe kidney: Secondary | ICD-10-CM

## 2020-07-01 DIAGNOSIS — K319 Disease of stomach and duodenum, unspecified: Secondary | ICD-10-CM | POA: Diagnosis present

## 2020-07-01 DIAGNOSIS — K209 Esophagitis, unspecified without bleeding: Secondary | ICD-10-CM | POA: Diagnosis not present

## 2020-07-01 DIAGNOSIS — I2510 Atherosclerotic heart disease of native coronary artery without angina pectoris: Secondary | ICD-10-CM | POA: Diagnosis present

## 2020-07-01 DIAGNOSIS — R109 Unspecified abdominal pain: Secondary | ICD-10-CM

## 2020-07-01 DIAGNOSIS — K2100 Gastro-esophageal reflux disease with esophagitis, without bleeding: Secondary | ICD-10-CM | POA: Diagnosis present

## 2020-07-01 DIAGNOSIS — K449 Diaphragmatic hernia without obstruction or gangrene: Secondary | ICD-10-CM | POA: Diagnosis present

## 2020-07-01 DIAGNOSIS — F17210 Nicotine dependence, cigarettes, uncomplicated: Secondary | ICD-10-CM | POA: Diagnosis present

## 2020-07-01 DIAGNOSIS — R1310 Dysphagia, unspecified: Secondary | ICD-10-CM

## 2020-07-01 DIAGNOSIS — F17200 Nicotine dependence, unspecified, uncomplicated: Secondary | ICD-10-CM | POA: Diagnosis not present

## 2020-07-01 DIAGNOSIS — E872 Acidosis: Secondary | ICD-10-CM | POA: Diagnosis not present

## 2020-07-01 DIAGNOSIS — R1319 Other dysphagia: Secondary | ICD-10-CM | POA: Diagnosis present

## 2020-07-01 DIAGNOSIS — Z818 Family history of other mental and behavioral disorders: Secondary | ICD-10-CM

## 2020-07-01 DIAGNOSIS — G4700 Insomnia, unspecified: Secondary | ICD-10-CM | POA: Diagnosis not present

## 2020-07-01 DIAGNOSIS — K922 Gastrointestinal hemorrhage, unspecified: Secondary | ICD-10-CM | POA: Diagnosis present

## 2020-07-01 DIAGNOSIS — M879 Osteonecrosis, unspecified: Secondary | ICD-10-CM | POA: Diagnosis present

## 2020-07-01 DIAGNOSIS — Z79890 Hormone replacement therapy (postmenopausal): Secondary | ICD-10-CM

## 2020-07-01 DIAGNOSIS — E876 Hypokalemia: Secondary | ICD-10-CM | POA: Diagnosis not present

## 2020-07-01 DIAGNOSIS — E860 Dehydration: Secondary | ICD-10-CM | POA: Diagnosis present

## 2020-07-01 DIAGNOSIS — F250 Schizoaffective disorder, bipolar type: Secondary | ICD-10-CM | POA: Diagnosis present

## 2020-07-01 DIAGNOSIS — E039 Hypothyroidism, unspecified: Secondary | ICD-10-CM | POA: Diagnosis present

## 2020-07-01 DIAGNOSIS — N179 Acute kidney failure, unspecified: Secondary | ICD-10-CM | POA: Diagnosis present

## 2020-07-01 DIAGNOSIS — I959 Hypotension, unspecified: Secondary | ICD-10-CM | POA: Diagnosis present

## 2020-07-01 DIAGNOSIS — K2950 Unspecified chronic gastritis without bleeding: Secondary | ICD-10-CM | POA: Diagnosis not present

## 2020-07-01 DIAGNOSIS — K2980 Duodenitis without bleeding: Secondary | ICD-10-CM | POA: Diagnosis not present

## 2020-07-01 HISTORY — DX: Hypothyroidism, unspecified: E03.9

## 2020-07-01 HISTORY — DX: Bipolar disorder, unspecified: F31.9

## 2020-07-01 LAB — BASIC METABOLIC PANEL
Anion gap: 8 (ref 5–15)
BUN: 19 mg/dL (ref 6–20)
CO2: 17 mmol/L — ABNORMAL LOW (ref 22–32)
Calcium: 8.5 mg/dL — ABNORMAL LOW (ref 8.9–10.3)
Chloride: 107 mmol/L (ref 98–111)
Creatinine, Ser: 3.47 mg/dL — ABNORMAL HIGH (ref 0.61–1.24)
GFR, Estimated: 20 mL/min — ABNORMAL LOW (ref >60)
Glucose, Bld: 93 mg/dL (ref 70–99)
Potassium: 3 mmol/L — ABNORMAL LOW (ref 3.5–5.1)
Sodium: 132 mmol/L — ABNORMAL LOW (ref 135–145)

## 2020-07-01 LAB — CBC
Hemoglobin: 13.5 g/dL (ref 13.0–17.0)
Platelets: 191 10*3/uL (ref 150–400)
WBC: 10.1 10*3/uL (ref 4.0–10.5)

## 2020-07-01 MED ORDER — LACTATED RINGERS IV BOLUS
1000.0000 mL | Freq: Once | INTRAVENOUS | Status: AC
  Administered 2020-07-01: 1000 mL via INTRAVENOUS

## 2020-07-01 MED ORDER — OXCARBAZEPINE 300 MG PO TABS: 600.0000 mg | ORAL_TABLET | Freq: Two times a day (BID) | ORAL | Status: DC

## 2020-07-01 MED ORDER — HYDROXYZINE HCL 25 MG PO TABS: 50.0000 mg | ORAL_TABLET | Freq: Three times a day (TID) | ORAL | Status: DC | PRN

## 2020-07-01 MED ORDER — NICOTINE 21 MG/24HR TD PT24
21.0000 mg | MEDICATED_PATCH | Freq: Every day | TRANSDERMAL | Status: DC
  Administered 2020-07-02 – 2020-07-07 (×7): 21 mg via TRANSDERMAL
  Filled 2020-07-01 (×7): qty 1

## 2020-07-01 MED ORDER — DIVALPROEX SODIUM 500 MG PO DR TAB: 500.0000 mg | DELAYED_RELEASE_TABLET | Freq: Two times a day (BID) | ORAL | Status: DC

## 2020-07-01 MED ORDER — POTASSIUM CHLORIDE IN NACL 40-0.9 MEQ/L-% IV SOLN
INTRAVENOUS | Status: AC
  Filled 2020-07-01 (×14): qty 1000

## 2020-07-01 MED ORDER — ONDANSETRON HCL 4 MG PO TABS: 4.0000 mg | ORAL_TABLET | Freq: Four times a day (QID) | ORAL | Status: DC | PRN

## 2020-07-01 MED ORDER — MIRTAZAPINE 15 MG PO TABS: 30.0000 mg | ORAL_TABLET | Freq: Every day | ORAL | Status: DC

## 2020-07-01 MED ORDER — ENOXAPARIN SODIUM 30 MG/0.3ML IJ SOSY
30.0000 mg | PREFILLED_SYRINGE | INTRAMUSCULAR | Status: DC
  Administered 2020-07-02 – 2020-07-06 (×6): 30 mg via SUBCUTANEOUS
  Filled 2020-07-01 (×8): qty 0.3

## 2020-07-01 MED ORDER — ONDANSETRON HCL 4 MG/2ML IJ SOLN
4.0000 mg | Freq: Four times a day (QID) | INTRAMUSCULAR | Status: DC | PRN
  Administered 2020-07-01 – 2020-07-04 (×2): 4 mg via INTRAVENOUS
  Filled 2020-07-01 (×2): qty 2

## 2020-07-01 MED ORDER — PALIPERIDONE ER 3 MG PO TB24: 6.0000 mg | ORAL_TABLET | Freq: Every day | ORAL | Status: DC

## 2020-07-01 MED ORDER — LEVOTHYROXINE SODIUM 100 MCG PO TABS
100.0000 ug | ORAL_TABLET | Freq: Every day | ORAL | Status: DC
  Administered 2020-07-02 – 2020-07-07 (×6): 100 ug via ORAL
  Filled 2020-07-01 (×2): qty 1
  Filled 2020-07-01: qty 2
  Filled 2020-07-01 (×3): qty 1

## 2020-07-01 MED ORDER — SODIUM CHLORIDE 0.9 % IV BOLUS
1000.0000 mL | Freq: Once | INTRAVENOUS | Status: AC
  Administered 2020-07-01: 1000 mL via INTRAVENOUS

## 2020-07-01 NOTE — ED Triage Notes (Signed)
Pt comes into the ED via [place] from [facility] c/o possible dehydration from decreased PO intake.  Pt states he feels like he cant eat or drink because it will causes emesis.  Pt also states he feels as thought the throat is tighter than normal.  Pt able to speak in full sentences at this time and has even and unlabored respirations.  Pt does have h/o of trach.

## 2020-07-01 NOTE — ED Provider Notes (Signed)
[facility] Emergency Department Provider Note ____________________________________________   Event Date/Time   First MD Initiated Contact with Patient 07/01/20 1818     (approximate)  I have reviewed the triage vital signs and the nursing notes.  HISTORY  Chief Complaint Dehydration   HPI Jaime Vargas is a 55 y.o. malewho presents to the ED for evaluation of possible dehydration.  Chart review indicates history of TBI and schizoaffective disorder. Remote trach, since decannulated.  All p.o. by mouth.  Patient presents to the ED from his long-term care home for evaluation of possible dehydration due to the new dysphagia of solids and abdominal pain.  Patient reports symptoms for the past 1 week, and he has never had these symptoms before.  Reports inability to tolerate solids due to "they will not go down" as well as postprandial abdominal pain.  Denies any diarrhea, chest pain.  Does report a nearly 20 pound weight loss over the past 1-2 weeks due to his dysphagia.  Reports he is able to get ice cream down and some liquids, but no solids.  Past Medical History:  Diagnosis Date  . Headache   . Heart murmur   . Schizoaffective disorder (HCC)   . Traumatic brain injury [facility])     Patient Active Problem List   Diagnosis Date Noted  . AKI (acute kidney injury) (HCC) 07/01/2020  . Dysphagia 07/01/2020  . Hypothyroidism 07/01/2020  . Tobacco use disorder 10/20/2014  . Schizoaffective disorder, bipolar type (HCC) 10/19/2014  . Traumatic brain injury (HCC) 10/19/2014    History reviewed. No pertinent surgical history.  Prior to Admission medications   Medication Sig Start Date End Date Taking? Authorizing Provider  benztropine (COGENTIN) 0.5 MG tablet Take 0.5 mg by mouth daily. 05/30/20  Yes [provider]  carbamazepine (TEGRETOL) 200 MG tablet Take 300 mg by mouth at bedtime. 05/30/20  Yes [provider]  Cholecalciferol (VITAMIN D3)  50 MCG (2000 UT) TABS Take 1 tablet by mouth daily. 05/30/20  Yes [provider]  emtricitabine-tenofovir (TRUVADA) 200-300 MG tablet Take 1 tablet by mouth daily. 06/30/20  Yes [provider]  fenofibrate 160 MG tablet Take 160 mg by mouth daily. 05/30/20  Yes [provider]  GOODSENSE ARTHRITIS PAIN 650 MG CR tablet Take 650 mg by mouth every 8 (eight) hours as needed for pain. 05/03/20  Yes [provider]  haloperidol decanoate (HALDOL DECANOATE) 100 MG/ML injection Inject 150 mg into the muscle every 30 (thirty) days. 06/10/20  Yes [provider]  hydrOXYzine (VISTARIL) 50 MG capsule Take 100 mg by mouth at bedtime. 05/30/20  Yes [provider]  ibuprofen (ADVIL) 600 MG tablet Take 600 mg by mouth every 8 (eight) hours as needed for pain. 05/03/20  Yes [provider]  levothyroxine (SYNTHROID) 100 MCG tablet Take 100 mcg by mouth daily. 05/30/20  Yes [provider]  mirtazapine (REMERON) 15 MG tablet Take 15 mg by mouth at bedtime. 06/16/20  Yes [provider]  propranolol (INDERAL) 10 MG tablet Take 10 mg by mouth 3 (three) times daily. 05/30/20  Yes [provider]  QUEtiapine (SEROQUEL) 200 MG tablet Take 200 mg by mouth at bedtime. 05/30/20  Yes [provider]  rosuvastatin (CRESTOR) 10 MG tablet Take 10 mg by mouth at bedtime. 05/30/20  Yes [provider]  tamsulosin (FLOMAX) 0.4 MG CAPS capsule Take 0.4 mg by mouth daily. 05/30/20  Yes [provider]  vitamin B-12 (CYANOCOBALAMIN) 100 MCG  tablet Take 100 mcg by mouth daily. 05/30/20  Yes [provider]  vitamin C (ASCORBIC ACID) 500 MG tablet Take 500 mg by mouth daily. 05/30/20  Yes [provider]  divalproex (DEPAKOTE) 500 MG DR tablet Take 1 tablet (500 mg total) by mouth 2 (two) times daily. Patient not taking: No sig reported 11/09/14   Clapacs, Jackquline Denmark, MD  hydrOXYzine (ATARAX/VISTARIL) 50 MG tablet Take 1 tablet  (50 mg total) by mouth 3 (three) times daily as needed. Patient not taking: No sig reported 11/09/14   Clapacs, Jackquline Denmark, MD  levothyroxine (SYNTHROID, LEVOTHROID) 125 MCG tablet Take 1 tablet (125 mcg total) by mouth daily before breakfast. Patient not taking: No sig reported 11/09/14   Clapacs, Jackquline Denmark, MD  mirtazapine (REMERON) 30 MG tablet Take 1 tablet (30 mg total) by mouth at bedtime. Patient not taking: No sig reported 11/09/14   Clapacs, Jackquline Denmark, MD  Oxcarbazepine (TRILEPTAL) 600 MG tablet Take 1 tablet (600 mg total) by mouth 2 (two) times daily. Patient not taking: No sig reported 11/09/14   Clapacs, Jackquline Denmark, MD  paliperidone (INVEGA) 6 MG 24 hr tablet Take 1 tablet (6 mg total) by mouth daily. Patient not taking: No sig reported 11/09/14   Clapacs, Jackquline Denmark, MD    Allergies Patient has no known allergies.  Family History  Problem Relation Age of Onset  . Schizophrenia Father     Social History Social History   Tobacco Use  . Smoking status: Current Every Day Smoker    Packs/day: 1.00    Years: 39.00    Pack years: 39.00    Types: Cigarettes  . Smokeless tobacco: Never Used  Vaping Use  . Vaping Use: Never used  Substance Use Topics  . Alcohol use: No  . Drug use: No    Comment: Patient reports past use    Review of Systems  Constitutional: No fever/chills.  Positive for weight loss unintentional Eyes: No visual changes. ENT: No sore throat.  Positive for new dysphagia of solids Cardiovascular: Denies chest pain. Respiratory: Denies shortness of breath. Gastrointestinal: Positive for postprandial abdominal pain   No diarrhea.  No constipation. Genitourinary: Negative for dysuria. Musculoskeletal: Negative for back pain. Skin: Negative for rash. Neurological: Negative for headaches, focal weakness or numbness. ____________________________________________   PHYSICAL EXAM:  VITAL SIGNS: Vitals:   07/01/20 2200 07/01/20 2230  BP: 108/74 121/70  Pulse: 75 75   Resp: 13 14  Temp: 98 F (36.7 C)   SpO2: 100% 100%    Constitutional: Alert and oriented. Well appearing and in no acute distress.  Cachectic. Eyes: Conjunctivae are normal. PERRL. EOMI. Head: Atraumatic. Nose: No congestion/rhinnorhea. Mouth/Throat: Mucous membranes are dry.  Oropharynx non-erythematous. Neck: No stridor. No cervical spine tenderness to palpation. Cardiovascular: Normal rate, regular rhythm. Grossly normal heart sounds.  Good peripheral circulation. Respiratory: Normal respiratory effort.  No retractions. Lungs CTAB. Gastrointestinal: Soft , nondistended. No CVA tenderness. Mild upper quadrant tenderness to palpation diffusely without peritoneal features. Musculoskeletal: No lower extremity tenderness nor edema.  No joint effusions. No signs of acute trauma. Neurologic:  Normal speech and language. No gross focal neurologic deficits are appreciated. No gait instability noted. Skin:  Skin is warm, dry and intact. No rash noted. Psychiatric: Mood and affect are normal. Speech and behavior are normal. ____________________________________________   LABS (all labs ordered are listed, but only abnormal results are displayed)  Labs Reviewed  BASIC METABOLIC PANEL - Abnormal; Notable for the following components:  Result Value   Sodium 132 (*)    Potassium 3.0 (*)    CO2 17 (*)    Creatinine, Ser 3.47 (*)    Calcium 8.5 (*)    GFR, Estimated 20 (*)    All other components within normal limits  SARS CORONAVIRUS 2 (TAT 6-24 HRS)  CBC  URINALYSIS, COMPLETE (UACMP) WITH MICROSCOPIC  HIV ANTIBODY (ROUTINE TESTING W REFLEX)  BASIC METABOLIC PANEL  CBC  CBG MONITORING, ED   ____________________________________________  12 Lead EKG  Sinus rhythm, rate of 78 bpm.  Normal axis and intervals.  No STEMI ____________________________________________  RADIOLOGY  ED MD interpretation:    Official radiology report(s): CT Soft Tissue Neck Wo Contrast  Result  Date: 07/01/2020 CLINICAL DATA:  Dysphagia of solids EXAM: CT NECK WITHOUT CONTRAST TECHNIQUE: Multidetector CT imaging of the neck was performed following the standard protocol without intravenous contrast. COMPARISON:  None. FINDINGS: PHARYNX AND LARYNX: The nasopharynx, oropharynx and larynx are normal. Visible portions of the oral cavity, tongue base and floor of mouth are normal. Normal epiglottis, vallecula and pyriform sinuses. The larynx is normal. No retropharyngeal abscess, effusion or lymphadenopathy. SALIVARY GLANDS: Normal parotid, submandibular and sublingual glands. THYROID: Normal. LYMPH NODES: No enlarged or abnormal density lymph nodes. VASCULAR: Negative LIMITED INTRACRANIAL: Normal. VISUALIZED ORBITS: Normal. MASTOIDS AND VISUALIZED PARANASAL SINUSES: No fluid levels or advanced mucosal thickening. No mastoid effusion. SKELETON: Reversal of normal cervical lordosis may be positional or due to muscle spasm. UPPER CHEST: Clear. OTHER: None. IMPRESSION: Normal CT of the neck.  No finding to explain reported dysphagia. Electronically Signed   By: Deatra Robinson M.D.   On: 07/01/2020 20:51   CT CHEST ABDOMEN PELVIS WO CONTRAST  Result Date: 07/01/2020 CLINICAL DATA:  Postprandial abdominal pain and emesis. Weight loss. Reduced appetite. EXAM: CT CHEST, ABDOMEN AND PELVIS WITHOUT CONTRAST TECHNIQUE: Multidetector CT imaging of the chest, abdomen and pelvis was performed following the standard protocol without IV contrast. COMPARISON:  None. FINDINGS: CT CHEST FINDINGS Cardiovascular: Minimal left anterior descending coronary artery atherosclerotic calcification. Small anterior pericardial effusion. Mediastinum/Nodes: Unremarkable Lungs/Pleura: Airway thickening is present, suggesting bronchitis or reactive airways disease. 2 mm in diameter calcified granuloma in the right lower lobe on image 125 series 3. 0.5 by 0.3 cm sub solid nodule in the right lower lobe on image 90 series 3. 2 mm calcified  granuloma in the left upper lobe, image 52 series 3. Musculoskeletal: Old left third and fourth rib deformities. CT ABDOMEN PELVIS FINDINGS Hepatobiliary: Unremarkable Pancreas: Unremarkable Spleen: Unremarkable Adrenals/Urinary Tract: The adrenal glands appear unremarkable. 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 There appear to be single bilateral ureters. Mildly distended urinary bladder, probably incidental. No urinary tract calculi are identified although there is a punctate parenchymal calcification near the junction of the fused right and left kidneys on image 82 series 4. Stomach/Bowel: Mild prominence of gas and stool in the colon. No dilated bowel or findings of malrotation. Vascular/Lymphatic: Aortoiliac atherosclerotic vascular disease. No pathologic adenopathy is identified. Reproductive: Moderately proximally retracted right testicle along the spermatic cord. Other: No supplemental non-categorized findings. Musculoskeletal: There are old deformities of the right pubic rami and of the left acetabulum and left femoral head. Left femoral IM nail with interlocking proximal screws noted. There is evidence of bilateral femoral head avascular necrosis with some femoral head irregularity on the left. Bilateral acetabular spurring, left  greater than right IMPRESSION: 1. A specific cause for the patient's postprandial abdominal pain and emesis is not identified. There is some mild prominence of gas and stool in the colon but the bowel appears otherwise unremarkable. 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 3. Aortic Atherosclerosis (ICD10-I70.0). Mild left anterior descending coronary artery atherosclerosis. 4. Old pelvic deformities. Avascular necrosis of both femoral heads with mild contour  irregularity of the left femoral head. 5. Small anterior pericardial effusion. 6. Airway thickening is present, suggesting bronchitis or reactive airways disease. 7. 4 mm sub solid nodule in the right lower lobe. No follow-up recommended. This recommendation follows the consensus statement: Guidelines for Management of Incidental Pulmonary Nodules Detected on CT Images: From the Fleischner Society 2017; Radiology 2017; 284:228-243. Electronically Signed   By: Gaylyn Rong M.D.   On: 07/01/2020 20:57    ____________________________________________   PROCEDURES and INTERVENTIONS  Procedure(s) performed (including Critical Care):  .1-3 Lead EKG Interpretation Performed by: Delton Prairie, MD Authorized by: Delton Prairie, MD     Interpretation: normal     ECG rate:  78   ECG rate assessment: normal     Rhythm: sinus rhythm     Ectopy: none     Conduction: normal      Medications  hydrOXYzine (ATARAX/VISTARIL) tablet 50 mg (has no administration in time range)  mirtazapine (REMERON) tablet 30 mg (has no administration in time range)  paliperidone (INVEGA) 24 hr tablet 6 mg (has no administration in time range)  levothyroxine (SYNTHROID) tablet 125 mcg (has no administration in time range)  divalproex (DEPAKOTE) DR tablet 500 mg (has no administration in time range)  Oxcarbazepine (TRILEPTAL) tablet 600 mg (has no administration in time range)  enoxaparin (LOVENOX) injection 30 mg (has no administration in time range)  0.9 % NaCl with KCl 40 mEq / L  infusion (has no administration in time range)  ondansetron (ZOFRAN) tablet 4 mg ( Oral See Alternative 07/01/20 2231)    Or  ondansetron (ZOFRAN) injection 4 mg (4 mg Intravenous Given 07/01/20 2231)  nicotine (NICODERM CQ - dosed in mg/24 hours) patch 21 mg (has no administration in time range)  lactated ringers bolus 1,000 mL (0 mLs Intravenous Stopped 07/01/20 2030)  sodium chloride 0.9 % bolus 1,000 mL (1,000 mLs Intravenous New  Bag/Given 07/01/20 2201)    ____________________________________________   MDM / ED COURSE   55 year old male presents to the ED with new onset of dysphagia of solid foods with associated weight loss and AKI requiring medical admission.  Normal vitals.  Exam with mild further localizing upper abdominal tenderness to palpation.  Blood work with AKI that appears prerenal for which she received lactated Ringer's.  Due to significant weight loss and new dysphagia of solids, CT obtained without evidence of mass, compression or structural pathology to contribute to his symptoms.  No evidence of intra-abdominal pathology or biliary pathology to cause his pain.  We will admit to hospitalist for further work-up and management and possibly EGD   Clinical Course as of 07/01/20 2342  Thu Jul 01, 2020  1906 Review of care everywhere demonstrates a metabolic panel from 03/2017 with creatinine of 1.5 and GFR 50. [DS]  2022 Discussed with the patient my recommendations for medical admission due to the newest dysphagia of solids and need for EGD.  He is agreeable. [DS]    Clinical Course User Index [DS] Delton Prairie, MD  ____________________________________________   FINAL CLINICAL IMPRESSION(S) / ED DIAGNOSES  Final diagnoses:  Abdominal pain  Other dysphagia     ED Discharge Orders    None       Aniqua Briere Katrinka BlazingSmith   Note:  This document was prepared using Dragon voice recognition software and may include unintentional dictation errors.   Delton PrairieSmith, [place] Goh, MD 07/01/20 [phone]

## 2020-07-01 NOTE — ED Notes (Signed)
Pt was given some water and a chocolate ice cream at this time.

## 2020-07-01 NOTE — ED Triage Notes (Signed)
First RN Note: pt to ED via [facility] from [facility] with c/o no appetite and decreased PO intake. Per EMS pt with hx of trach, states feels like can't get PO down due to "throat being tight". Per EMS VSS en route.

## 2020-07-01 NOTE — H&P (Signed)
History and Physical    Jaime Vargas YTK:[id number] DOB: 30-Jun-1965 DOA: 07/01/2020  PCP: [facility]   Patient coming from: Rest home  I have personally briefly reviewed patient's old medical records in [facility]  Chief Complaint: Difficulty swallowing  HPI: Jaime Vargas is a 55 y.o. male with medical history significant for traumatic brain injury, schizoaffective disorder, remote history of tracheostomy status post decannulation who presents to the emergency room from the rest home where he resides for evaluation of dysphagia for solids and significant weight loss.  Patient states that he is able to tolerate liquid diet but has trouble swallowing solids and everything comes back up as they will not go down.  He also complains of postprandial abdominal pain. Patient has had about a 20 pound weight loss over the last 1 month according to patient. He denies having any nausea, no vomiting, no chest pain, no changes in his bowel habits, no urinary symptoms, no headache, no shortness of breath, no fever, no chills, no palpitations, no diaphoresis, no leg swelling, no blurred vision. Labs show sodium 132, potassium 3.0, chloride 107, bicarb 17, glucose 93, BUN 19, creatinine 3.47 compared to baseline of 1.5 (2019), calcium 8.5, white count 10.1, hemoglobin 13.5, platelet count 191 CT scan of soft tissue neck without contrast is within normal limits CT scan of chest, abdomen and pelvis shows no specific cause for the patient's postprandial abdominal pain and emesis is  identified. There is some mild prominence of gas and stool in the colon but the bowel appears otherwise unremarkable. 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 Aortic Atherosclerosis (ICD10-I70.0). Mild left anterior descending coronary artery atherosclerosis.Old pelvic deformities. Avascular necrosis of both femoral heads with  mild contour irregularity of the left femoral head. Small anterior pericardial effusion. Airway thickening is present, suggesting bronchitis or reactive airways disease. 4 mm sub solid nodule in the right lower lobe. No follow-up recommended Twelve-lead EKG reviewed by me shows sinus rhythm.    ED Course: Patient is a 55 year old male who resides in a rest home and was sent to the ER for evaluation of dysphagia for solids and significant weight loss. Patient was noted to be hypotensive in the ER, with systolic blood pressure in the 60s but is awake and alert. He is noted to have worsening of his renal function from baseline and today on admission his serum creatinine is 3.47 compared to baseline of 1.5. He will be admitted to the hospital for further evaluation.   Review of Systems: As per HPI otherwise all other systems reviewed and negative.    Past Medical History:  Diagnosis Date  . Headache   . Heart murmur   . Schizoaffective disorder (HCC)   . Traumatic brain injury [facility])     History reviewed. No pertinent surgical history.   reports that he has been smoking cigarettes. He has a 39.00 pack-year smoking history. He has never used smokeless tobacco. He reports that he does not drink alcohol and does not use drugs.  No Known Allergies  Family History  Problem Relation Age of Onset  . Schizophrenia Father       Prior to Admission medications   Medication Sig Start Date End Date Taking? Authorizing Provider  divalproex (DEPAKOTE) 500 MG DR tablet Take 1 tablet (500 mg total) by mouth 2 (two) times daily. 11/09/14   Clapacs, Jackquline Denmark, MD  hydrOXYzine (ATARAX/VISTARIL) 50 MG tablet Take  1 tablet (50 mg total) by mouth 3 (three) times daily as needed. 11/09/14   Clapacs, Jackquline DenmarkJohn T, MD  levothyroxine (SYNTHROID, LEVOTHROID) 125 MCG tablet Take 1 tablet (125 mcg total) by mouth daily before breakfast. 11/09/14   Clapacs, Jackquline DenmarkJohn T, MD  mirtazapine (REMERON) 30 MG tablet Take 1 tablet  (30 mg total) by mouth at bedtime. 11/09/14   Clapacs, Jackquline DenmarkJohn T, MD  Oxcarbazepine (TRILEPTAL) 600 MG tablet Take 1 tablet (600 mg total) by mouth 2 (two) times daily. 11/09/14   Clapacs, Jackquline DenmarkJohn T, MD  paliperidone (INVEGA) 6 MG 24 hr tablet Take 1 tablet (6 mg total) by mouth daily. 11/09/14   Clapacs, Jackquline DenmarkJohn T, MD    Physical Exam: Vitals:   07/01/20 1930 07/01/20 2000 07/01/20 2100 07/01/20 2200  BP: 125/69 120/74 134/88 108/74  Pulse: 76 77 80 75  Resp: 16 14 13 13   Temp:    98 F (36.7 C)  TempSrc:      SpO2: 100% 100% 100% 100%  Weight:      Height:         Vitals:   07/01/20 1930 07/01/20 2000 07/01/20 2100 07/01/20 2200  BP: 125/69 120/74 134/88 108/74  Pulse: 76 77 80 75  Resp: 16 14 13 13   Temp:    98 F (36.7 C)  TempSrc:      SpO2: 100% 100% 100% 100%  Weight:      Height:          Constitutional: Alert and oriented x 3. Not in any apparent distress.  Thin and frail HEENT:      Head: Normocephalic and atraumatic.         Eyes: PERLA, EOMI, Conjunctivae are normal. Sclera is non-icteric.       Mouth/Throat: Mucous membranes are moist.       Neck: Supple with no signs of meningismus. Cardiovascular: Regular rate and rhythm. No murmurs, gallops, or rubs. 2+ symmetrical distal pulses are present . No JVD. No LE edema Respiratory: Respiratory effort normal .Lungs sounds clear bilaterally. No wheezes, crackles, or rhonchi.  Gastrointestinal: Soft, non tender and non distended with positive bowel sounds.  Genitourinary: No CVA tenderness. Musculoskeletal: Nontender with normal range of motion in all extremities. No cyanosis, or erythema of extremities. Neurologic:  Face is symmetric. Moving all extremities. No gross focal neurologic deficits . Skin: Skin is warm, dry.  No rash or ulcers Psychiatric: Mood and affect are normal    Labs on Admission: I have personally reviewed following labs and imaging studies  CBC: Recent Labs  Lab 07/01/20 1450  WBC 10.1  HGB  13.5  HCT RESULTS UNAVAILABLE DUE TO INTERFERING SUBSTANCE  MCV RESULTS UNAVAILABLE DUE TO INTERFERING SUBSTANCE  PLT 191   Basic Metabolic Panel: Recent Labs  Lab 07/01/20 1450  NA 132*  K 3.0*  CL 107  CO2 17*  GLUCOSE 93  BUN 19  CREATININE 3.47*  CALCIUM 8.5*   GFR: Estimated Creatinine Clearance: 16.9 mL/min (A) (by C-G formula based on SCr of 3.47 mg/dL (H)). Liver Function Tests: No results for input(s): AST, ALT, ALKPHOS, BILITOT, PROT, ALBUMIN in the last 168 hours. No results for input(s): LIPASE, AMYLASE in the last 168 hours. No results for input(s): AMMONIA in the last 168 hours. Coagulation Profile: No results for input(s): INR, PROTIME in the last 168 hours. Cardiac Enzymes: No results for input(s): CKTOTAL, CKMB, CKMBINDEX, TROPONINI in the last 168 hours. BNP (last 3 results) No results for input(s): PROBNP in  the last 8760 hours. HbA1C: No results for input(s): HGBA1C in the last 72 hours. CBG: No results for input(s): GLUCAP in the last 168 hours. Lipid Profile: No results for input(s): CHOL, HDL, LDLCALC, TRIG, CHOLHDL, LDLDIRECT in the last 72 hours. Thyroid Function Tests: No results for input(s): TSH, T4TOTAL, FREET4, T3FREE, THYROIDAB in the last 72 hours. Anemia Panel: No results for input(s): VITAMINB12, FOLATE, FERRITIN, TIBC, IRON, RETICCTPCT in the last 72 hours. Urine analysis:    Component Value Date/Time   COLORURINE YELLOW (A) 11/05/2014 1939   APPEARANCEUR CLEAR (A) 11/05/2014 1939   LABSPEC 1.012 11/05/2014 1939   PHURINE 6.0 11/05/2014 1939   GLUCOSEU NEGATIVE 11/05/2014 1939   HGBUR NEGATIVE 11/05/2014 1939   BILIRUBINUR NEGATIVE 11/05/2014 1939   KETONESUR NEGATIVE 11/05/2014 1939   PROTEINUR NEGATIVE 11/05/2014 1939   NITRITE NEGATIVE 11/05/2014 1939   LEUKOCYTESUR NEGATIVE 11/05/2014 1939    Radiological Exams on Admission: CT Soft Tissue Neck Wo Contrast  Result Date: 07/01/2020 CLINICAL DATA:  Dysphagia of solids  EXAM: CT NECK WITHOUT CONTRAST TECHNIQUE: Multidetector CT imaging of the neck was performed following the standard protocol without intravenous contrast. COMPARISON:  None. FINDINGS: PHARYNX AND LARYNX: The nasopharynx, oropharynx and larynx are normal. Visible portions of the oral cavity, tongue base and floor of mouth are normal. Normal epiglottis, vallecula and pyriform sinuses. The larynx is normal. No retropharyngeal abscess, effusion or lymphadenopathy. SALIVARY GLANDS: Normal parotid, submandibular and sublingual glands. THYROID: Normal. LYMPH NODES: No enlarged or abnormal density lymph nodes. VASCULAR: Negative LIMITED INTRACRANIAL: Normal. VISUALIZED ORBITS: Normal. MASTOIDS AND VISUALIZED PARANASAL SINUSES: No fluid levels or advanced mucosal thickening. No mastoid effusion. SKELETON: Reversal of normal cervical lordosis may be positional or due to muscle spasm. UPPER CHEST: Clear. OTHER: None. IMPRESSION: Normal CT of the neck.  No finding to explain reported dysphagia. Electronically Signed   By: Deatra Robinson M.D.   On: 07/01/2020 20:51   CT CHEST ABDOMEN PELVIS WO CONTRAST  Result Date: 07/01/2020 CLINICAL DATA:  Postprandial abdominal pain and emesis. Weight loss. Reduced appetite. EXAM: CT CHEST, ABDOMEN AND PELVIS WITHOUT CONTRAST TECHNIQUE: Multidetector CT imaging of the chest, abdomen and pelvis was performed following the standard protocol without IV contrast. COMPARISON:  None. FINDINGS: CT CHEST FINDINGS Cardiovascular: Minimal left anterior descending coronary artery atherosclerotic calcification. Small anterior pericardial effusion. Mediastinum/Nodes: Unremarkable Lungs/Pleura: Airway thickening is present, suggesting bronchitis or reactive airways disease. 2 mm in diameter calcified granuloma in the right lower lobe on image 125 series 3. 0.5 by 0.3 cm sub solid nodule in the right lower lobe on image 90 series 3. 2 mm calcified granuloma in the left upper lobe, image 52 series 3.  Musculoskeletal: Old left third and fourth rib deformities. CT ABDOMEN PELVIS FINDINGS Hepatobiliary: Unremarkable Pancreas: Unremarkable Spleen: Unremarkable Adrenals/Urinary Tract: The adrenal glands appear unremarkable. 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 There appear to be single bilateral ureters. Mildly distended urinary bladder, probably incidental. No urinary tract calculi are identified although there is a punctate parenchymal calcification near the junction of the fused right and left kidneys on image 82 series 4. Stomach/Bowel: Mild prominence of gas and stool in the colon. No dilated bowel or findings of malrotation. Vascular/Lymphatic: Aortoiliac atherosclerotic vascular disease. No pathologic adenopathy is identified. Reproductive: Moderately proximally retracted right testicle along the spermatic cord. Other: No supplemental non-categorized findings. Musculoskeletal: There are old  deformities of the right pubic rami and of the left acetabulum and left femoral head. Left femoral IM nail with interlocking proximal screws noted. There is evidence of bilateral femoral head avascular necrosis with some femoral head irregularity on the left. Bilateral acetabular spurring, left greater than right IMPRESSION: 1. A specific cause for the patient's postprandial abdominal pain and emesis is not identified. There is some mild prominence of gas and stool in the colon but the bowel appears otherwise unremarkable. 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 3. Aortic Atherosclerosis (ICD10-I70.0). Mild left anterior descending coronary artery atherosclerosis. 4. Old pelvic deformities. Avascular necrosis of both femoral heads with mild contour irregularity of the left femoral head. 5. Small  anterior pericardial effusion. 6. Airway thickening is present, suggesting bronchitis or reactive airways disease. 7. 4 mm sub solid nodule in the right lower lobe. No follow-up recommended. This recommendation follows the consensus statement: Guidelines for Management of Incidental Pulmonary Nodules Detected on CT Images: From the Fleischner Society 2017; Radiology 2017; 284:228-243. Electronically Signed   By: Gaylyn Rong M.D.   On: 07/01/2020 20:57     Assessment/Plan Principal Problem:   AKI (acute kidney injury) (HCC) Active Problems:   Schizoaffective disorder, bipolar type (HCC)   Tobacco use disorder   Dysphagia   Hypothyroidism     Acute kidney injury Most likely prerenal and related to poor oral intake secondary to dysphagia Patient has a baseline serum creatinine of 1.5 but on admission it is 3.47 Will hydrate patient Repeat renal parameters in a.m.    Dysphagia Mostly to solids associated with significant weight loss We will consult GI for endoscopic evaluation     Nicotine dependence Smoking cessation has been discussed with patient in detail We will place patient on a nicotine transdermal patch 21 mg daily     Schizoaffective disorder Continue Depakote, mirtazapine, Trileptal and Invega.    Hypothyroidism Continue Synthroid  DVT prophylaxis: Lovenox Code Status: full code Family Communication: Greater than 50% of time was spent discussing patient's condition and plan of care with him at the bedside.  He lists his brother Jaime Vargas as his healthcare power of attorney.  All questions and concerns have been addressed.  He verbalizes understanding and agrees with the plan. Disposition Plan: Back to previous home environment Consults called: Gastroenterology Status: At the time of admission, it appears that the appropriate admission status for this patient is inpatient. This is judged to be reasonable and necessary in order to provide the  required intensity of service to ensure the patient's safety given the presenting symptoms, physical exam findings, and initial radiographic and laboratory data in the context of their comorbid conditions. Patient requires inpatient status due to high intensity of service, high risk for further deterioration and high frequency of surveillance required.    Lucile Shutters MD [facility]     07/01/2020, 10:06 PM

## 2020-07-02 ENCOUNTER — Other Ambulatory Visit: Payer: Self-pay

## 2020-07-02 ENCOUNTER — Encounter: Payer: Self-pay | Admitting: Internal Medicine

## 2020-07-02 ENCOUNTER — Inpatient Hospital Stay: Payer: Medicare Other | Admitting: Anesthesiology

## 2020-07-02 ENCOUNTER — Encounter
Admission: EM | Disposition: A | Discharge status: Home / Self Care | Payer: Self-pay | Source: Home / Self Care | Attending: Family Medicine

## 2020-07-02 DIAGNOSIS — E43 Unspecified severe protein-calorie malnutrition: Secondary | ICD-10-CM | POA: Insufficient documentation

## 2020-07-02 DIAGNOSIS — R1319 Other dysphagia: Principal | ICD-10-CM

## 2020-07-02 HISTORY — PX: ESOPHAGOGASTRODUODENOSCOPY (EGD) WITH PROPOFOL: SHX5813

## 2020-07-02 LAB — BASIC METABOLIC PANEL
Anion gap: 5 (ref 5–15)
BUN: 17 mg/dL (ref 6–20)
CO2: 17 mmol/L — ABNORMAL LOW (ref 22–32)
Calcium: 7.8 mg/dL — ABNORMAL LOW (ref 8.9–10.3)
Chloride: 116 mmol/L — ABNORMAL HIGH (ref 98–111)
Creatinine, Ser: 3.07 mg/dL — ABNORMAL HIGH (ref 0.61–1.24)
GFR, Estimated: 23 mL/min — ABNORMAL LOW (ref >60)
Glucose, Bld: 84 mg/dL (ref 70–99)
Potassium: 2.6 mmol/L — CL (ref 3.5–5.1)
Sodium: 138 mmol/L (ref 135–145)

## 2020-07-02 LAB — POTASSIUM
Potassium: 2.8 mmol/L — ABNORMAL LOW (ref 3.5–5.1)
Potassium: 3 mmol/L — ABNORMAL LOW (ref 3.5–5.1)
Potassium: 3 mmol/L — ABNORMAL LOW (ref 3.5–5.1)

## 2020-07-02 LAB — CBC
HCT: UNDETERMINED % (ref 39.0–52.0)
Hemoglobin: 10.6 g/dL — ABNORMAL LOW (ref 13.0–17.0)
MCH: 34.8 pg — ABNORMAL HIGH (ref 26.0–34.0)
MCHC: UNDETERMINED g/dL (ref 30.0–36.0)
MCV: UNDETERMINED fL (ref 80.0–100.0)
Platelets: 165 10*3/uL (ref 150–400)
RBC: UNDETERMINED MIL/uL (ref 4.22–5.81)
RDW: UNDETERMINED % (ref 11.5–15.5)
WBC: 8.4 10*3/uL (ref 4.0–10.5)
nRBC: 0 % (ref 0.0–0.2)

## 2020-07-02 LAB — IRON AND TIBC
Iron: 165 ug/dL (ref 45–182)
Saturation Ratios: 91 % — ABNORMAL HIGH (ref 17.9–39.5)
TIBC: 182 ug/dL — ABNORMAL LOW (ref 250–450)
UIBC: 17 ug/dL

## 2020-07-02 LAB — SARS CORONAVIRUS 2 (TAT 6-24 HRS): SARS Coronavirus 2: NEGATIVE

## 2020-07-02 LAB — FERRITIN: Ferritin: 342 ng/mL — ABNORMAL HIGH (ref 24–336)

## 2020-07-02 LAB — MAGNESIUM: Magnesium: 1.9 mg/dL (ref 1.7–2.4)

## 2020-07-02 LAB — VITAMIN B12: Vitamin B-12: 347 pg/mL (ref 180–914)

## 2020-07-02 LAB — HIV ANTIBODY (ROUTINE TESTING W REFLEX): HIV Screen 4th Generation wRfx: NONREACTIVE

## 2020-07-02 LAB — FOLATE: Folate: 8.1 ng/mL (ref >5.9)

## 2020-07-02 SURGERY — ESOPHAGOGASTRODUODENOSCOPY (EGD) WITH PROPOFOL
Anesthesia: General

## 2020-07-02 MED ORDER — PROPOFOL 500 MG/50ML IV EMUL
INTRAVENOUS | Status: DC | PRN
  Administered 2020-07-02: 150 ug/kg/min via INTRAVENOUS

## 2020-07-02 MED ORDER — MIRTAZAPINE 15 MG PO TBDP
15.0000 mg | ORAL_TABLET | Freq: Every day | ORAL | Status: DC
  Administered 2020-07-02 – 2020-07-06 (×5): 15 mg via ORAL
  Filled 2020-07-02 (×8): qty 1

## 2020-07-02 MED ORDER — PROPRANOLOL HCL 10 MG PO TABS
10.0000 mg | ORAL_TABLET | Freq: Three times a day (TID) | ORAL | Status: DC
  Administered 2020-07-02 – 2020-07-07 (×14): 10 mg via ORAL
  Filled 2020-07-02 (×19): qty 1

## 2020-07-02 MED ORDER — HYDROXYZINE HCL 50 MG PO TABS
50.0000 mg | ORAL_TABLET | Freq: Every day | ORAL | Status: DC
  Administered 2020-07-02 – 2020-07-06 (×5): 50 mg via ORAL
  Filled 2020-07-02: qty 1
  Filled 2020-07-02 (×2): qty 2
  Filled 2020-07-02: qty 1
  Filled 2020-07-02: qty 2
  Filled 2020-07-02: qty 1

## 2020-07-02 MED ORDER — CARBAMAZEPINE 200 MG PO TABS
200.0000 mg | ORAL_TABLET | Freq: Every day | ORAL | Status: DC
  Administered 2020-07-02 – 2020-07-06 (×5): 200 mg via ORAL
  Filled 2020-07-02 (×8): qty 1

## 2020-07-02 MED ORDER — SUCRALFATE 1 GM/10ML PO SUSP
1.0000 g | Freq: Three times a day (TID) | ORAL | Status: DC
  Administered 2020-07-02 – 2020-07-07 (×20): 1 g via ORAL
  Filled 2020-07-02 (×21): qty 10

## 2020-07-02 MED ORDER — POTASSIUM CHLORIDE 10 MEQ/100ML IV SOLN
10.0000 meq | INTRAVENOUS | Status: AC
  Administered 2020-07-02: 10 meq via INTRAVENOUS
  Filled 2020-07-02 (×2): qty 100

## 2020-07-02 MED ORDER — TAMSULOSIN HCL 0.4 MG PO CAPS
0.4000 mg | ORAL_CAPSULE | Freq: Every day | ORAL | Status: DC
  Administered 2020-07-02 – 2020-07-07 (×6): 0.4 mg via ORAL
  Filled 2020-07-02 (×6): qty 1

## 2020-07-02 MED ORDER — PROPOFOL 10 MG/ML IV BOLUS
INTRAVENOUS | Status: AC
  Filled 2020-07-02: qty 20

## 2020-07-02 MED ORDER — POTASSIUM CHLORIDE 10 MEQ/100ML IV SOLN
10.0000 meq | INTRAVENOUS | Status: DC
  Filled 2020-07-02 (×2): qty 100

## 2020-07-02 MED ORDER — LIDOCAINE HCL (CARDIAC) PF 100 MG/5ML IV SOSY
PREFILLED_SYRINGE | INTRAVENOUS | Status: DC | PRN
  Administered 2020-07-02: 40 mg via INTRAVENOUS

## 2020-07-02 MED ORDER — SODIUM CHLORIDE 0.9 % IV SOLN
INTRAVENOUS | Status: DC
Start: 2020-07-02 — End: 2020-07-02

## 2020-07-02 MED ORDER — SODIUM CHLORIDE 0.9 % IV SOLN: INTRAVENOUS | Status: DC | PRN

## 2020-07-02 MED ORDER — EMTRICITABINE-TENOFOVIR AF 200-25 MG PO TABS
1.0000 | ORAL_TABLET | Freq: Every day | ORAL | Status: DC
  Administered 2020-07-03 – 2020-07-06 (×4): 1 via ORAL
  Filled 2020-07-02 (×5): qty 1

## 2020-07-02 MED ORDER — ENSURE ENLIVE PO LIQD
237.0000 mL | Freq: Three times a day (TID) | ORAL | Status: DC
  Administered 2020-07-02 – 2020-07-07 (×14): 237 mL via ORAL

## 2020-07-02 MED ORDER — BENZTROPINE MESYLATE 0.5 MG PO TABS
0.5000 mg | ORAL_TABLET | Freq: Every day | ORAL | Status: DC
  Administered 2020-07-02 – 2020-07-07 (×6): 0.5 mg via ORAL
  Filled 2020-07-02 (×7): qty 1

## 2020-07-02 MED ORDER — QUETIAPINE FUMARATE 200 MG PO TABS
200.0000 mg | ORAL_TABLET | Freq: Every day | ORAL | Status: DC
  Administered 2020-07-03 – 2020-07-06 (×5): 200 mg via ORAL
  Filled 2020-07-02: qty 8
  Filled 2020-07-02 (×5): qty 1

## 2020-07-02 MED ORDER — TEMAZEPAM 7.5 MG PO CAPS
15.0000 mg | ORAL_CAPSULE | Freq: Every evening | ORAL | Status: DC | PRN
  Administered 2020-07-03: 15 mg via ORAL
  Filled 2020-07-02: qty 1

## 2020-07-02 MED ORDER — PROPOFOL 10 MG/ML IV BOLUS
INTRAVENOUS | Status: DC | PRN
  Administered 2020-07-02: 50 mg via INTRAVENOUS

## 2020-07-02 MED ORDER — MAGNESIUM SULFATE 2 GM/50ML IV SOLN
2.0000 g | Freq: Once | INTRAVENOUS | Status: AC
  Administered 2020-07-02: 2 g via INTRAVENOUS
  Filled 2020-07-02: qty 50

## 2020-07-02 MED ORDER — ADULT MULTIVITAMIN W/MINERALS CH
1.0000 | ORAL_TABLET | Freq: Every day | ORAL | Status: DC
  Administered 2020-07-02 – 2020-07-07 (×6): 1 via ORAL
  Filled 2020-07-02 (×7): qty 1

## 2020-07-02 MED ORDER — POTASSIUM CHLORIDE CRYS ER 20 MEQ PO TBCR
40.0000 meq | EXTENDED_RELEASE_TABLET | Freq: Once | ORAL | Status: AC
  Administered 2020-07-02: 40 meq via ORAL
  Filled 2020-07-02: qty 2

## 2020-07-02 MED ORDER — PANTOPRAZOLE SODIUM 40 MG IV SOLR
40.0000 mg | Freq: Two times a day (BID) | INTRAVENOUS | Status: DC
  Administered 2020-07-02 – 2020-07-05 (×7): 40 mg via INTRAVENOUS
  Filled 2020-07-02 (×8): qty 40

## 2020-07-02 MED ORDER — LIDOCAINE HCL (PF) 2 % IJ SOLN
INTRAMUSCULAR | Status: AC
  Filled 2020-07-02: qty 2

## 2020-07-02 MED ORDER — POTASSIUM CHLORIDE 2 MEQ/ML FOR ORAL USE: 40.0000 meq | Freq: Every day | ORAL | Status: DC

## 2020-07-02 MED ORDER — CARBAMAZEPINE 200 MG PO TABS
300.0000 mg | ORAL_TABLET | Freq: Every day | ORAL | Status: DC
  Filled 2020-07-02: qty 1.5

## 2020-07-02 NOTE — Progress Notes (Signed)
Initial Nutrition Assessment  DOCUMENTATION CODES:  Severe malnutrition in context of chronic illness  INTERVENTION:  Advance diet as medically able and as tolerated.  Consider SLP consult to determine texture of solids and liquids post-procedure.  Add Ensure Enlive po TID, each supplement provides 350 kcal and 20 grams of protein once diet is advanced and thickened to appropriate consistency per diet order.  Add Magic cup TID with meals, each supplement provides 290 kcal and 9 grams of protein once diet is advanced.  Add MVI with minerals daily.  NUTRITION DIAGNOSIS:  Severe Malnutrition related to chronic illness,dysphagia as evidenced by percent weight loss,severe fat depletion,severe muscle depletion.  GOAL:  Patient will meet greater than or equal to 90% of their needs  MONITOR:  Diet advancement,PO intake,Supplement acceptance,Labs,Weight trends,I & O's  REASON FOR ASSESSMENT:  Malnutrition Screening Tool    ASSESSMENT:  55 yo male with a PMH of traumatic brain injury, schizoaffective disorder, remote history of tracheostomy status post decannulation who presents to the emergency room from the rest home where he resides for evaluation of dysphagia for solids and significant weight loss.  GI awaiting K level to increase to at least 3 mg/dL before performing EGD.  Per H&P, "Patient states that he is able to tolerate liquid diet but has trouble swallowing solids and everything comes back up as they will not go down.  He also complains of postprandial abdominal pain. Patient has had about a 20 pound weight loss over the last 1 month according to patient."  Spoke with pt at bedside briefly. Pt able to reconfirm the above information.  Per Epic, pt has lost 38 lbs (26%) in a little over 6 months. This is significant and very severe for the time frame.  On exam, pt severely depleted in all areas.  Given above information, pt severely malnourished in the chronic setting. Pt  likely moderate for a while and this episode able to increase the severity of the malnutrition.  Pt currently NPO for procedure. Once diet is advanced and pt seen by SLP, recommend adding Ensure Enlive TID and Magic Cup TID. Also recommend adding MVI with minerals daily.  Medications: reviewed; Synthroid, Remeron, NaCl with KCl 40 mEq @ 125 ml/hr per IV, Mag Sulfate via IV, KCl 10 mEq via IV  Labs: reviewed; K 2.8, serum Ca 7.8  NUTRITION - FOCUSED PHYSICAL EXAM: Flowsheet Row Most Recent Value  Orbital Region Severe depletion  Upper Arm Region Severe depletion  Thoracic and Lumbar Region Severe depletion  Buccal Region Severe depletion  Temple Region Severe depletion  Clavicle Bone Region Severe depletion  Clavicle and Acromion Bone Region Severe depletion  Scapular Bone Region Severe depletion  Dorsal Hand Severe depletion  Patellar Region Severe depletion  Anterior Thigh Region Severe depletion  Posterior Calf Region Severe depletion  Edema (RD Assessment) None  Hair Reviewed  Eyes Reviewed  Mouth Reviewed  Skin Reviewed  Nails Reviewed     Diet Order:   Diet Order            Diet NPO time specified  Diet effective now                EDUCATION NEEDS:  Education needs have been addressed  Skin:  Skin Assessment: Reviewed RN Assessment  Last BM:  PTA/unknown  Height:  Ht Readings from Last 1 Encounters:  07/01/20 5' 9.5" (1.765 m)   Weight:  Wt Readings from Last 1 Encounters:  07/02/20 48.4 kg   Ideal Body  Weight:  74.1 kg  BMI:  Body mass index is 15.52 kg/m.  Estimated Nutritional Needs:  Kcal:  2100-2300 Protein:  85-100 grams Fluid:  >2 L  Vertell Limber, RD, LDN Registered Dietitian After Hours/Weekend Pager # in Amion

## 2020-07-02 NOTE — Anesthesia Preprocedure Evaluation (Signed)
Anesthesia Evaluation  Patient identified by MRN, date of birth, ID band Patient awake    Reviewed: Allergy & Precautions, H&P , NPO status , Patient's Chart, lab work & pertinent test results, reviewed documented beta blocker date and time   Airway Mallampati: II   Neck ROM: full    Dental  (+) Poor Dentition, Teeth Intact   Pulmonary neg pulmonary ROS, Current Smoker,    Pulmonary exam normal        Cardiovascular Exercise Tolerance: Good Normal cardiovascular exam+ Valvular Problems/Murmurs  Rhythm:regular Rate:Normal     Neuro/Psych  Headaches, PSYCHIATRIC DISORDERS Bipolar Disorder Schizophrenia    GI/Hepatic negative GI ROS, Neg liver ROS,   Endo/Other  Hypothyroidism   Renal/GU ESRFRenal disease  negative genitourinary   Musculoskeletal   Abdominal   Peds  Hematology negative hematology ROS (+)   Anesthesia Other Findings Past Medical History: No date: Bipolar disorder (HCC) No date: Headache No date: Heart murmur No date: Hypothyroidism No date: Schizoaffective disorder (HCC) No date: Traumatic brain injury [facility]) History reviewed. No pertinent surgical history. BMI    Body Mass Index: 15.52 kg/m     Reproductive/Obstetrics negative OB ROS                             Anesthesia Physical Anesthesia Plan  ASA: III and emergent  Anesthesia Plan: General   Post-op Pain Management:    Induction:   PONV Risk Score and Plan:   Airway Management Planned:   Additional Equipment:   Intra-op Plan:   Post-operative Plan:   Informed Consent: I have reviewed the patients History and Physical, chart, labs and discussed the procedure including the risks, benefits and alternatives for the proposed anesthesia with the patient or authorized representative who has indicated his/her understanding and acceptance.     Dental Advisory Given  Plan Discussed with:  CRNA  Anesthesia Plan Comments:         Anesthesia Quick Evaluation

## 2020-07-02 NOTE — Consult Note (Signed)
Cephas Darby, MD [address]  [place]  [place], [place] [address]  Main: [phone]  Fax: [phone] Pager: [phone]   Consultation  Referring Provider:     No ref. provider found Primary Care Physician:  Inc, Ruth Primary Gastroenterologist: Althia Forts you        Reason for Consultation: Dysphagia and weight loss  Date of Admission:  07/01/2020 Date of Consultation:  07/02/2020         HPI:   Jaime Vargas is a 55 y.o. male with history of traumatic brain injury, bipolar disorder is sent from group home secondary to significant, unintentional weight loss and difficulty swallowing.  Patient reports that he has not been able to eat any solid food for last 2 weeks.  He is able to drink liquids.  He appears cachectic, had about 30 pound weight loss within last 1 month.  He presented with severe AKI and hypokalemia from dehydration.  He does smoke tobacco.  He underwent CT chest abdomen and pelvis which did not reveal any space-occupying lesion in the mediastinum or the esophagus.  CT soft tissue neck was also normal.   NSAIDs: None  Antiplts/Anticoagulants/Anti thrombotics: None  GI Procedures: None  Past Medical History:  Diagnosis Date  . Bipolar disorder ([place])   . Headache   . Heart murmur   . Hypothyroidism   . Schizoaffective disorder ([place])   . Traumatic brain injury [facility])     History reviewed. No pertinent surgical history.  Prior to Admission medications   Medication Sig Start Date End Date Taking? Authorizing Provider  benztropine (COGENTIN) 0.5 MG tablet Take 0.5 mg by mouth daily. 05/30/20  Yes [provider]  carbamazepine (TEGRETOL) 200 MG tablet Take 300 mg by mouth at bedtime. 05/30/20  Yes [provider]  Cholecalciferol (VITAMIN D3) 50 MCG (2000 UT) TABS Take 1 tablet by mouth daily. 05/30/20  Yes [provider]  emtricitabine-tenofovir (TRUVADA) 200-300 MG tablet Take 1 tablet by mouth daily.  06/30/20  Yes [provider]  fenofibrate 160 MG tablet Take 160 mg by mouth daily. 05/30/20  Yes [provider]  GOODSENSE ARTHRITIS PAIN 650 MG CR tablet Take 650 mg by mouth every 8 (eight) hours as needed for pain. 05/03/20  Yes [provider]  haloperidol decanoate (HALDOL DECANOATE) 100 MG/ML injection Inject 150 mg into the muscle every 30 (thirty) days. 06/10/20  Yes [provider]  hydrOXYzine (VISTARIL) 50 MG capsule Take 100 mg by mouth at bedtime. 05/30/20  Yes [provider]  ibuprofen (ADVIL) 600 MG tablet Take 600 mg by mouth every 8 (eight) hours as needed for pain. 05/03/20  Yes [provider]  levothyroxine (SYNTHROID) 100 MCG tablet Take 100 mcg by mouth daily. 05/30/20  Yes [provider]  mirtazapine (REMERON) 15 MG tablet Take 15 mg by mouth at bedtime. 06/16/20  Yes [provider]  propranolol (INDERAL) 10 MG tablet Take 10 mg by mouth 3 (three) times daily. 05/30/20  Yes [provider]  QUEtiapine (SEROQUEL) 200 MG tablet Take 200 mg by mouth at bedtime. 05/30/20  Yes [provider]  rosuvastatin (CRESTOR) 10 MG tablet Take 10 mg by mouth at bedtime. 05/30/20  Yes [provider]  tamsulosin (FLOMAX) 0.4 MG CAPS capsule Take 0.4 mg by mouth daily. 05/30/20  Yes [provider]  vitamin B-12 (CYANOCOBALAMIN) 100 MCG tablet Take 100 mcg by mouth daily. 05/30/20  Yes [provider]  vitamin C (ASCORBIC ACID) 500 MG tablet Take 500 mg by mouth daily. 05/30/20  Yes [provider]  divalproex (DEPAKOTE) 500 MG DR tablet Take 1 tablet (500 mg total) by mouth 2 (two) times daily. Patient not taking: No sig reported 11/09/14   Clapacs, Madie Reno, MD  hydrOXYzine (ATARAX/VISTARIL) 50 MG tablet Take 1 tablet (50 mg total) by mouth 3 (three) times daily as needed. Patient not taking: No sig reported 11/09/14   Clapacs, Madie Reno, MD  levothyroxine (SYNTHROID, LEVOTHROID) 125 MCG  tablet Take 1 tablet (125 mcg total) by mouth daily before breakfast. Patient not taking: No sig reported 11/09/14   Clapacs, Madie Reno, MD  mirtazapine (REMERON) 30 MG tablet Take 1 tablet (30 mg total) by mouth at bedtime. Patient not taking: No sig reported 11/09/14   Clapacs, Madie Reno, MD  Oxcarbazepine (TRILEPTAL) 600 MG tablet Take 1 tablet (600 mg total) by mouth 2 (two) times daily. Patient not taking: No sig reported 11/09/14   Clapacs, Madie Reno, MD  paliperidone (INVEGA) 6 MG 24 hr tablet Take 1 tablet (6 mg total) by mouth daily. Patient not taking: No sig reported 11/09/14   Clapacs, Madie Reno, MD   Current Facility-Administered Medications:  .  0.9 % NaCl with KCl 40 mEq / L  infusion, , Intravenous, Continuous, Agbata, Tochukwu, MD, Last Rate: 125 mL/hr at 07/02/20 1032, Infusion Verify at 07/02/20 1032 .  benztropine (COGENTIN) tablet 0.5 mg, 0.5 mg, Oral, Daily, Clapacs, John T, MD .  carbamazepine (TEGRETOL) tablet 200 mg, 200 mg, Oral, QHS, Clapacs, John T, MD .  emtricitabine-tenofovir AF (DESCOVY) 200-25 MG per tablet 1 tablet, 1 tablet, Oral, Daily, Manuella Ghazi, Vipul, MD .  enoxaparin (LOVENOX) injection 30 mg, 30 mg, Subcutaneous, Q24H, Agbata, Tochukwu, MD, 30 mg at 07/02/20 0100 .  hydrOXYzine (ATARAX/VISTARIL) tablet 50 mg, 50 mg, Oral, QHS, Clapacs, John T, MD .  levothyroxine (SYNTHROID) tablet 100 mcg, 100 mcg, Oral, Q0600, Agbata, Tochukwu, MD, 100 mcg at 07/02/20 0651 .  magnesium sulfate IVPB 2 g 50 mL, 2 g, Intravenous, Once, Oswald Hillock, RPH .  mirtazapine (REMERON SOL-TAB) disintegrating tablet 15 mg, 15 mg, Oral, QHS, Clapacs, John T, MD .  nicotine (NICODERM CQ - dosed in mg/24 hours) patch 21 mg, 21 mg, Transdermal, Daily, Agbata, Tochukwu, MD, 21 mg at 07/02/20 0939 .  ondansetron (ZOFRAN) tablet 4 mg, 4 mg, Oral, Q6H PRN **OR** ondansetron (ZOFRAN) injection 4 mg, 4 mg, Intravenous, Q6H PRN, Agbata, Tochukwu, MD, 4 mg at 07/01/20 2231 .  potassium chloride 10 mEq in  100 mL IVPB, 10 mEq, Intravenous, Q1 Hr x 2, Oswald Hillock, [place] .  propranolol (INDERAL) tablet 10 mg, 10 mg, Oral, TID, Clapacs, John T, MD .  QUEtiapine (SEROQUEL) tablet 200 mg, 200 mg, Oral, QHS, Clapacs, John T, MD .  tamsulosin (FLOMAX) capsule 0.4 mg, 0.4 mg, Oral, Daily, Clapacs, John T, MD .  temazepam (RESTORIL) capsule 15 mg, 15 mg, Oral, QHS PRN, Clapacs, Madie Reno, MD   Family History  Problem Relation Age of Onset  . Schizophrenia Father      Social History   Tobacco Use  . Smoking status: Current Every Day Smoker    Packs/day: 1.00    Years: 39.00    Pack years: 39.00    Types: Cigarettes  . Smokeless tobacco: Never Used  Vaping Use  . Vaping Use: Never used  Substance Use Topics  . Alcohol use: No  . Drug use: No  Comment: Patient reports past use    Allergies as of 07/01/2020  . (No Known Allergies)    Review of Systems:    All systems reviewed and negative except where noted in HPI.   Physical Exam:  Vital signs in last 24 hours: Temp:  [97.8 F (36.6 C)-98.7 F (37.1 C)] 98.4 F (36.9 C) (06/03 0942) Pulse Rate:  [54-95] 64 (06/03 0942) Resp:  [13-18] 15 (06/03 0942) BP: (105-134)/(61-88) 114/76 (06/03 0942) SpO2:  [98 %-100 %] 100 % (06/03 0942) Weight:  [48.4 kg-48.5 kg] 48.4 kg (06/03 0036)   General: Ill-appearing, cachectic, cooperative in NAD Head:  Normocephalic and atraumatic, bitemporal wasting. Eyes:   No icterus.   Conjunctiva pink. PERRLA. Ears:  Normal auditory acuity. Neck:  Supple; no masses or thyroidomegaly Lungs: Respirations even and unlabored. Lungs clear to auscultation bilaterally.   No wheezes, crackles, or rhonchi.  Heart:  Regular rate and rhythm;  Without murmur, clicks, rubs or gallops Abdomen:  Soft, nondistended, nontender. Normal bowel sounds. No appreciable masses or hepatomegaly.  No rebound or guarding.  Rectal:  Not performed. Msk:  Symmetrical without gross deformities.  Strength generalized  weakness Extremities:  Without edema, cyanosis or clubbing. Neurologic:  Alert and oriented x3;  grossly normal neurologically. Skin:  Intact without significant lesions or rashes, dry skin. Psych:  Alert and cooperative. Normal affect.  LAB RESULTS: CBC Latest Ref Rng & Units 07/02/2020 07/01/2020 11/05/2014  WBC 4.0 - 10.5 K/uL 8.4 10.1 9.6  Hemoglobin 13.0 - 17.0 g/dL 10.6(L) 13.5 15.2  Hematocrit 39.0 - 52.0 % Unable to determine due to a cold agglutinin RESULTS UNAVAILABLE DUE TO INTERFERING SUBSTANCE 45.2  Platelets 150 - 400 K/uL 165 191 171    BMET BMP Latest Ref Rng & Units 07/02/2020 07/02/2020 07/01/2020  Glucose 70 - 99 mg/dL - 84 93  BUN 6 - 20 mg/dL - 17 19  Creatinine 0.61 - 1.24 mg/dL - 3.07(H) 3.47(H)  Sodium 135 - 145 mmol/L - 138 132(L)  Potassium 3.5 - 5.1 mmol/L 2.8(L) 2.6(LL) 3.0(L)  Chloride 98 - 111 mmol/L - 116(H) 107  CO2 22 - 32 mmol/L - 17(L) 17(L)  Calcium 8.9 - 10.3 mg/dL - 7.8(L) 8.5(L)    LFT Hepatic Function Latest Ref Rng & Units 11/05/2014 10/29/2014 10/19/2014  Total Protein 6.5 - 8.1 g/dL 7.2 6.9 6.9  Albumin 3.5 - 5.0 g/dL 4.5 4.2 4.3  AST 15 - 41 U/L $Remo'16 17 23  'xTIlH$ ALT 17 - 63 U/L 11(L) 10(L) 12(L)  Alk Phosphatase 38 - 126 U/L 68 68 71  Total Bilirubin 0.3 - 1.2 mg/dL <0.1(L) 0.3 0.5     STUDIES: CT Soft Tissue Neck Wo Contrast  Result Date: 07/01/2020 CLINICAL DATA:  Dysphagia of solids EXAM: CT NECK WITHOUT CONTRAST TECHNIQUE: Multidetector CT imaging of the neck was performed following the standard protocol without intravenous contrast. COMPARISON:  None. FINDINGS: PHARYNX AND LARYNX: The nasopharynx, oropharynx and larynx are normal. Visible portions of the oral cavity, tongue base and floor of mouth are normal. Normal epiglottis, vallecula and pyriform sinuses. The larynx is normal. No retropharyngeal abscess, effusion or lymphadenopathy. SALIVARY GLANDS: Normal parotid, submandibular and sublingual glands. THYROID: Normal. LYMPH NODES: No enlarged or  abnormal density lymph nodes. VASCULAR: Negative LIMITED INTRACRANIAL: Normal. VISUALIZED ORBITS: Normal. MASTOIDS AND VISUALIZED PARANASAL SINUSES: No fluid levels or advanced mucosal thickening. No mastoid effusion. SKELETON: Reversal of normal cervical lordosis may be positional or due to muscle spasm. UPPER CHEST: Clear. OTHER: None. IMPRESSION: Normal  CT of the neck.  No finding to explain reported dysphagia. Electronically Signed   By: Ulyses Jarred M.D.   On: 07/01/2020 20:51   CT CHEST ABDOMEN PELVIS WO CONTRAST  Result Date: 07/01/2020 CLINICAL DATA:  Postprandial abdominal pain and emesis. Weight loss. Reduced appetite. EXAM: CT CHEST, ABDOMEN AND PELVIS WITHOUT CONTRAST TECHNIQUE: Multidetector CT imaging of the chest, abdomen and pelvis was performed following the standard protocol without IV contrast. COMPARISON:  None. FINDINGS: CT CHEST FINDINGS Cardiovascular: Minimal left anterior descending coronary artery atherosclerotic calcification. Small anterior pericardial effusion. Mediastinum/Nodes: Unremarkable Lungs/Pleura: Airway thickening is present, suggesting bronchitis or reactive airways disease. 2 mm in diameter calcified granuloma in the right lower lobe on image 125 series 3. 0.5 by 0.3 cm sub solid nodule in the right lower lobe on image 90 series 3. 2 mm calcified granuloma in the left upper lobe, image 52 series 3. Musculoskeletal: Old left third and fourth rib deformities. CT ABDOMEN PELVIS FINDINGS Hepatobiliary: Unremarkable Pancreas: Unremarkable Spleen: Unremarkable Adrenals/Urinary Tract: The adrenal glands appear unremarkable. 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 There appear to be single bilateral ureters. Mildly distended urinary bladder, probably incidental. No urinary tract calculi are identified although there is a punctate parenchymal  calcification near the junction of the fused right and left kidneys on image 82 series 4. Stomach/Bowel: Mild prominence of gas and stool in the colon. No dilated bowel or findings of malrotation. Vascular/Lymphatic: Aortoiliac atherosclerotic vascular disease. No pathologic adenopathy is identified. Reproductive: Moderately proximally retracted right testicle along the spermatic cord. Other: No supplemental non-categorized findings. Musculoskeletal: There are old deformities of the right pubic rami and of the left acetabulum and left femoral head. Left femoral IM nail with interlocking proximal screws noted. There is evidence of bilateral femoral head avascular necrosis with some femoral head irregularity on the left. Bilateral acetabular spurring, left greater than right IMPRESSION: 1. A specific cause for the patient's postprandial abdominal pain and emesis is not identified. There is some mild prominence of gas and stool in the colon but the bowel appears otherwise unremarkable. 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 3. Aortic Atherosclerosis (ICD10-I70.0). Mild left anterior descending coronary artery atherosclerosis. 4. Old pelvic deformities. Avascular necrosis of both femoral heads with mild contour irregularity of the left femoral head. 5. Small anterior pericardial effusion. 6. Airway thickening is present, suggesting bronchitis or reactive airways disease. 7. 4 mm sub solid nodule in the right lower lobe. No follow-up recommended. This recommendation follows the consensus statement: Guidelines for Management of Incidental Pulmonary Nodules Detected on CT Images: From the Fleischner Society 2017; Radiology 2017; 284:228-243. Electronically Signed   By: Van Clines M.D.   On: 07/01/2020 20:57      Impression / Plan:   Jaime Vargas is a 55 y.o. male with history of bipolar, traumatic brain injury is admitted  with failure to thrive, difficulty swallowing solids, AKI and hypokalemia  Recommend EGD for further evaluation after rebleeding patient's potassium Would like to see potassium about 3 to proceed with EGD N.p.o. for now   Thank you for involving me in the care of this patient.      LOS: 1 day   Sherri Sear, MD  07/02/2020, 11:28 AM   Note: This dictation was prepared with Dragon dictation along with smaller phrase  technology. Any transcriptional errors that result from this process are unintentional.

## 2020-07-02 NOTE — Progress Notes (Signed)
1        [place] at [facility]   PATIENT NAME: Jaime Vargas    MR#:  [id number]  DATE OF BIRTH:  26-Jul-1965  SUBJECTIVE:  CHIEF COMPLAINT:   Chief Complaint  Patient presents with  . Dehydration  Reports significant dysphagia leading to not able to eat for at least last 1 to 2 weeks leading to increased weight loss REVIEW OF SYSTEMS:  Review of Systems  Constitutional: Positive for malaise/fatigue and weight loss. Negative for diaphoresis and fever.  HENT: Negative for ear discharge, ear pain, hearing loss, nosebleeds, sore throat and tinnitus.   Eyes: Negative for blurred vision and pain.  Respiratory: Negative for cough, hemoptysis, shortness of breath and wheezing.   Cardiovascular: Negative for chest pain, palpitations, orthopnea and leg swelling.  Gastrointestinal: Negative for abdominal pain, blood in stool, constipation, diarrhea, heartburn, nausea and vomiting.       Dysphagia  Genitourinary: Negative for dysuria, frequency and urgency.  Musculoskeletal: Negative for back pain and myalgias.  Skin: Negative for itching and rash.  Neurological: Negative for dizziness, tingling, tremors, focal weakness, seizures, weakness and headaches.  Psychiatric/Behavioral: Negative for depression. The patient is not nervous/anxious.    DRUG ALLERGIES:  No Known Allergies VITALS:  Blood pressure 122/79, pulse 63, temperature (!) 97.2 F (36.2 C), temperature source Temporal, resp. rate 15, height 5' 9.5" (1.765 m), weight 48.5 kg, SpO2 99 %. PHYSICAL EXAMINATION:  Physical Exam  55 year old cachectic looking male lying in the bed comfortably without any acute distress.  He looks chronically ill with temporal wasting and significant muscle mass loss Eyes pupils equal round reactive to light and accommodation Lungs clear to auscultation bilaterally no wheezing rales rhonchi or crepitation Cardiovascular S1-S2 normal, no murmur rales or gallop Abdomen soft,  benign Neurological alert and oriented, nonfocal exam Skin no rash or lesion Psych blunted affect, normal mood LABORATORY PANEL:  Male CBC Recent Labs  Lab 07/02/20 0526  WBC 8.4  HGB 10.6*  HCT Unable to determine due to a cold agglutinin  PLT 165   ------------------------------------------------------------------------------------------------------------------ Chemistries  Recent Labs  Lab 07/02/20 0526 07/02/20 0941 07/02/20 1118  NA 138  --   --   K 2.6*   < > 3.0*  CL 116*  --   --   CO2 17*  --   --   GLUCOSE 84  --   --   BUN 17  --   --   CREATININE 3.07*  --   --   CALCIUM 7.8*  --   --   MG  --   --  1.9   < > = values in this interval not displayed.   RADIOLOGY:  CT Soft Tissue Neck Wo Contrast  Result Date: 07/01/2020 CLINICAL DATA:  Dysphagia of solids EXAM: CT NECK WITHOUT CONTRAST TECHNIQUE: Multidetector CT imaging of the neck was performed following the standard protocol without intravenous contrast. COMPARISON:  None. FINDINGS: PHARYNX AND LARYNX: The nasopharynx, oropharynx and larynx are normal. Visible portions of the oral cavity, tongue base and floor of mouth are normal. Normal epiglottis, vallecula and pyriform sinuses. The larynx is normal. No retropharyngeal abscess, effusion or lymphadenopathy. SALIVARY GLANDS: Normal parotid, submandibular and sublingual glands. THYROID: Normal. LYMPH NODES: No enlarged or abnormal density lymph nodes. VASCULAR: Negative LIMITED INTRACRANIAL: Normal. VISUALIZED ORBITS: Normal. MASTOIDS AND VISUALIZED PARANASAL SINUSES: No fluid levels or advanced mucosal thickening. No mastoid effusion. SKELETON: Reversal of normal cervical lordosis may be positional or due to muscle  spasm. UPPER CHEST: Clear. OTHER: None. IMPRESSION: Normal CT of the neck.  No finding to explain reported dysphagia. Electronically Signed   By: Deatra Robinson M.D.   On: 07/01/2020 20:51   CT CHEST ABDOMEN PELVIS WO CONTRAST  Result Date:  07/01/2020 CLINICAL DATA:  Postprandial abdominal pain and emesis. Weight loss. Reduced appetite. EXAM: CT CHEST, ABDOMEN AND PELVIS WITHOUT CONTRAST TECHNIQUE: Multidetector CT imaging of the chest, abdomen and pelvis was performed following the standard protocol without IV contrast. COMPARISON:  None. FINDINGS: CT CHEST FINDINGS Cardiovascular: Minimal left anterior descending coronary artery atherosclerotic calcification. Small anterior pericardial effusion. Mediastinum/Nodes: Unremarkable Lungs/Pleura: Airway thickening is present, suggesting bronchitis or reactive airways disease. 2 mm in diameter calcified granuloma in the right lower lobe on image 125 series 3. 0.5 by 0.3 cm sub solid nodule in the right lower lobe on image 90 series 3. 2 mm calcified granuloma in the left upper lobe, image 52 series 3. Musculoskeletal: Old left third and fourth rib deformities. CT ABDOMEN PELVIS FINDINGS Hepatobiliary: Unremarkable Pancreas: Unremarkable Spleen: Unremarkable Adrenals/Urinary Tract: The adrenal glands appear unremarkable. 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 There appear to be single bilateral ureters. Mildly distended urinary bladder, probably incidental. No urinary tract calculi are identified although there is a punctate parenchymal calcification near the junction of the fused right and left kidneys on image 82 series 4. Stomach/Bowel: Mild prominence of gas and stool in the colon. No dilated bowel or findings of malrotation. Vascular/Lymphatic: Aortoiliac atherosclerotic vascular disease. No pathologic adenopathy is identified. Reproductive: Moderately proximally retracted right testicle along the spermatic cord. Other: No supplemental non-categorized findings. Musculoskeletal: There are old deformities of the right pubic rami and of the left acetabulum and left femoral  head. Left femoral IM nail with interlocking proximal screws noted. There is evidence of bilateral femoral head avascular necrosis with some femoral head irregularity on the left. Bilateral acetabular spurring, left greater than right IMPRESSION: 1. A specific cause for the patient's postprandial abdominal pain and emesis is not identified. There is some mild prominence of gas and stool in the colon but the bowel appears otherwise unremarkable. 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 3. Aortic Atherosclerosis (ICD10-I70.0). Mild left anterior descending coronary artery atherosclerosis. 4. Old pelvic deformities. Avascular necrosis of both femoral heads with mild contour irregularity of the left femoral head. 5. Small anterior pericardial effusion. 6. Airway thickening is present, suggesting bronchitis or reactive airways disease. 7. 4 mm sub solid nodule in the right lower lobe. No follow-up recommended. This recommendation follows the consensus statement: Guidelines for Management of Incidental Pulmonary Nodules Detected on CT Images: From the Fleischner Society 2017; Radiology 2017; 284:228-243. Electronically Signed   By: Gaylyn Rong M.D.   On: 07/01/2020 20:57   ASSESSMENT AND PLAN:  55 y.o. male with medical history significant for traumatic brain injury, schizoaffective disorder, remote history of tracheostomy status post decannulation is admitted for evaluation of dysphagia for solids and significant weight loss (20 pounds in last 1 month according to patient).   Acute kidney injury Most likely prerenal and related to poor oral intake secondary to dysphagia Patient has a baseline serum creatinine of 1.5 but on admission it is 3.47->3.07 Monitor renal function and avoid nephrotoxic medication  Dysphagia S/p EGD on 6/3 showing severe erosive esophagitis, duodenal  ulcers and gastric erosions Advance diet as  tolerated Continue Carafate and PPI.  Discharge on Prilosec 40 mg p.o. twice daily instead of Protonix per GI and outpatient follow-up with Dr. Allegra Lai in 1 to 2 months  Hypokalemia Replete and recheck, check magnesium Pharmacy managing this  Severe protein calorie malnutrition Body mass index is 15.57 kg/m. Nutritionist consult  Nicotine dependence Smoking cessation has been discussed with patient in detail by admitting provider - nicotine transdermal patch 21 mg daily  Schizoaffective disorder Continue Seroquel, Remeron, hydroxyzine, Tegretol. As needed Restoril at night for sleep  Hypothyroidism Continue Synthroid  Body mass index is 15.57 kg/m.  Net IO Since Admission: 1,490.78 mL [07/02/20 1341]      Status is: Inpatient  Remains inpatient appropriate because:Inpatient level of care appropriate due to severity of illness   Dispo: The patient is from: Home              Anticipated d/c is to: Home              Patient currently is not medically stable to d/c.   Difficult to place patient No       DVT prophylaxis:       enoxaparin (LOVENOX) injection 30 mg Start: 07/01/20 2200     Family Communication: "discussed with patient"   All the records are reviewed and case discussed with Care Management/Social Worker. Management plans discussed with the patient, nursing, GI, psychiatry and they are in agreement.  CODE STATUS: Full Code Level of care: Progressive Cardiac  TOTAL TIME TAKING CARE OF THIS PATIENT: 35 minutes.   More than 50% of the time was spent in counseling/coordination of care: YES  POSSIBLE D/C IN 1-2 DAYS, DEPENDING ON CLINICAL CONDITION.   Delfino Lovett M.D on 07/02/2020 at 1:41 PM  [facility]   CC: Primary care physician; [employer], [employer]  Note: This dictation was prepared with Nurse, children's dictation along with smaller Lobbyist. Any transcriptional errors that result from this process are unintentional.

## 2020-07-02 NOTE — Consult Note (Signed)
[facility] Consult   Reason for Consult: Consult for 55 year old man with a past history of schizoaffective disorder currently on the medical service with difficulty swallowing, mild weight loss, renal failure.  Consult to evaluate psych meds. Referring Physician:  Sherryll BurgerShah Patient Identification: Jaime Vargas MRN:  [id number] Principal Diagnosis: Schizoaffective disorder, bipolar type (HCC) Diagnosis:  Principal Problem:   Schizoaffective disorder, bipolar type (HCC) Active Problems:   Tobacco use disorder   AKI (acute kidney injury) (HCC)   Dysphagia   Hypothyroidism   Total Time spent with patient: 1 hour  Subjective:   Jaime Vargas is a 55 y.o. male patient admitted with "I cannot swallow".  HPI: Patient seen chart reviewed.  This is a 55 year old man with a long history of schizoaffective disorder.  He is in the hospital for complaints of weight loss related to an inability to swallow over the past several weeks.  As far as psychiatric symptoms the patient says he is feeling fine.  Denies feeling depressed.  Denies feeling angry.  Denies any hostility or thoughts of harming anyone.  Denies any suicidal thoughts.  Denies having any hallucinations.  I reviewed his current psychiatric and other medications with his group home.  He is getting Haldol decanoate monthly but it is not due until June 15.  He gets 200 mg of Tegretol at night and 200 mg of Seroquel at night and 15 mg of mirtazapine.  Patient is currently on Descovy or Truvada suggesting that he is HIV positive.  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  History of schizoaffective disorder.  Several years ago we used to see him somewhat frequently in the emergency room when he  would usually come in after making threats out of anger towards his group home.  No known actual history of violence or suicidal behavior.  Risk to Self:   Risk to Others:   Prior Inpatient Therapy:   Prior Outpatient Therapy:    Past Medical History:  Past Medical History:  Diagnosis Date  . Bipolar disorder (HCC)   . Headache   . Heart murmur   . Hypothyroidism   . Schizoaffective disorder (HCC)   . Traumatic brain injury [facility](HCC)    History reviewed. No pertinent surgical history. Family History:  Family History  Problem Relation Age of Onset  . Schizophrenia Father    Family Psychiatric  History: Reportedly father had schizophrenia. Social History:  Social History   Substance and Sexual Activity  Alcohol Use No     Social History   Substance and Sexual Activity  Drug Use No   Comment: Patient reports past use    Social History   Socioeconomic History  . Marital status: Divorced    Spouse name: Not on file  . Number of children: Not on file  . Years of education: Not on file  . Highest education level: Not on file  Occupational History  . Not on file  Tobacco Use  . Smoking status: Current Every Day Smoker    Packs/day: 1.00    Years: 39.00    Pack years: 39.00    Types: Cigarettes  . Smokeless tobacco: Never Used  Vaping Use  . Vaping Use: Never used  Substance and Sexual Activity  . Alcohol use: No  . Drug use: No    Comment: Patient reports past use  . Sexual activity: Not Currently    Birth control/protection: None  Other Topics Concern  . Not on file  Social History Narrative  . Not on file   Social Determinants of Health   Financial Resource Strain: Not on file  Food Insecurity: Not on file  Transportation Needs: Not on file  Physical Activity: Not on file  Stress: Not on file  Social Connections: Not on file   Additional Social History:    Allergies:  No Known Allergies  Labs:  Results for orders placed or performed during the  hospital encounter of 07/01/20 (from the past 48 hour(s))  Basic metabolic panel     Status: Abnormal   Collection Time: 07/01/20  2:50 PM  Result Value Ref Range   Sodium 132 (L) 135 - 145 mmol/L   Potassium 3.0 (L) 3.5 - 5.1 mmol/L   Chloride 107 98 - 111 mmol/L   CO2 17 (L) 22 - 32 mmol/L   Glucose, Bld 93 70 - 99 mg/dL    Comment: Glucose reference range applies only to samples taken after fasting for at least 8 hours.   BUN 19 6 - 20 mg/dL   Creatinine, Ser 4.46 (H) 0.61 - 1.24 mg/dL   Calcium 8.5 (L) 8.9 - 10.3 mg/dL   GFR, Estimated 20 (L) >60 mL/min    Comment: (NOTE) Calculated using the CKD-EPI Creatinine Equation (2021)    Anion gap 8 5 - 15    Comment: Performed at [facility], [address] [place], [place] [address]  CBC     Status: None   Collection Time: 07/01/20  2:50 PM  Result Value Ref Range   WBC 10.1 4.0 - 10.5 K/uL   RBC RESULTS UNAVAILABLE DUE TO INTERFERING SUBSTANCE 4.22 - 5.81 MIL/uL   Hemoglobin 13.5 13.0 - 17.0 g/dL   HCT RESULTS UNAVAILABLE DUE TO INTERFERING SUBSTANCE 39.0 - 52.0 %   MCV RESULTS UNAVAILABLE DUE TO INTERFERING SUBSTANCE 80.0 - 100.0 fL   MCH RESULTS UNAVAILABLE DUE TO INTERFERING SUBSTANCE 26.0 - 34.0 pg   MCHC RESULTS UNAVAILABLE DUE TO INTERFERING SUBSTANCE 30.0 - 36.0 g/dL   RDW RESULTS UNAVAILABLE DUE TO INTERFERING SUBSTANCE 11.5 - 15.5 %   Platelets 191 150 - 400 K/uL   nRBC RESULTS UNAVAILABLE DUE TO INTERFERING SUBSTANCE 0.0 - 0.2 %    Comment: Performed at [facility], [address] [place], [place] [address]  SARS CORONAVIRUS 2 (TAT 6-24 HRS) Nasopharyngeal Nasopharyngeal Swab     Status: None   Collection Time: 07/01/20  6:54 PM   Specimen: Nasopharyngeal Swab  Result Value Ref Range   SARS Coronavirus 2 NEGATIVE NEGATIVE    Comment: (NOTE) SARS-CoV-2 target nucleic acids are NOT DETECTED.  The SARS-CoV-2 RNA is generally detectable in upper and lower respiratory specimens during the acute  phase of infection. Negative results do not preclude SARS-CoV-2 infection, do not rule out co-infections with other pathogens, and should not be used as the sole basis for treatment or other patient management decisions. Negative results must be combined with clinical observations, patient history, and epidemiological information. The expected result is Negative.  Fact Sheet for Patients: HairSlick.no  Fact Sheet for Healthcare Providers: quierodirigir.com  This test is not yet approved or cleared by the [place] FDA and  has been authorized for detection and/or diagnosis of SARS-CoV-2 by  FDA under an Emergency Use Authorization (EUA). This EUA will remain  in effect (meaning this test can be used) for the duration of the COVID-19 declaration under Se ction 564(b)(1) of the Act, 21 U.S.C. section 360bbb-3(b)(1), unless the authorization is terminated or revoked sooner.  Performed at [facility], [address] [place], [place] [address]   Basic metabolic panel     Status: Abnormal   Collection Time: 07/02/20  5:26 AM  Result Value Ref Range   Sodium 138 135 - 145 mmol/L   Potassium 2.6 (LL) 3.5 - 5.1 mmol/L    Comment: CRITICAL RESULT CALLED TO, READ BACK BY AND VERIFIED WITH [facility] TAYLOR AT 4782 07/02/20.PMF   Chloride 116 (H) 98 - 111 mmol/L   CO2 17 (L) 22 - 32 mmol/L   Glucose, Bld 84 70 - 99 mg/dL    Comment: Glucose reference range applies only to samples taken after fasting for at least 8 hours.   BUN 17 6 - 20 mg/dL   Creatinine, Ser 9.56 (H) 0.61 - 1.24 mg/dL   Calcium 7.8 (L) 8.9 - 10.3 mg/dL   GFR, Estimated 23 (L) >60 mL/min    Comment: (NOTE) Calculated using the CKD-EPI Creatinine Equation (2021)    Anion gap 5 5 - 15    Comment: Performed at [facility], [address] [place], [place] [address]  CBC     Status: Abnormal   Collection Time: 07/02/20  5:26 AM  Result Value Ref Range    WBC 8.4 4.0 - 10.5 K/uL   RBC Unable to determine due to a cold agglutinin 4.22 - 5.81 MIL/uL   Hemoglobin 10.6 (L) 13.0 - 17.0 g/dL   HCT Unable to determine due to a cold agglutinin 39.0 - 52.0 %   MCV Unable to determine due to a cold agglutinin 80.0 - 100.0 fL   MCH 34.8 (H) 26.0 - 34.0 pg   MCHC Unable to determine due to a cold agglutinin 30.0 - 36.0 g/dL   RDW Unable to determine due to a cold agglutinin 11.5 - 15.5 %   Platelets 165 150 - 400 K/uL   nRBC 0.0 0.0 - 0.2 %    Comment: Performed at [facility], [address], [place], [place] [address]  Potassium     Status: Abnormal   Collection Time: 07/02/20  9:41 AM  Result Value Ref Range   Potassium 2.8 (L) 3.5 - 5.1 mmol/L    Comment: Performed at [facility], [address] [place], [place] [address]    Current Facility-Administered Medications  Medication Dose Route Frequency Provider Last Rate Last Admin  . 0.9 % NaCl with KCl 40 mEq / L  infusion   Intravenous Continuous Agbata, Tochukwu, MD 125 mL/hr at 07/02/20 1032 Infusion Verify at 07/02/20 1032  . benztropine (COGENTIN) tablet 0.5 mg  0.5 mg Oral Daily Bill Yohn T, MD      . carbamazepine (TEGRETOL) tablet 200 mg  200 mg Oral QHS Sondi Desch T, MD      . emtricitabine-tenofovir AF (DESCOVY) 200-25 MG per tablet 1 tablet  1 tablet Oral Daily Sherryll Burger, Vipul, MD      . enoxaparin (LOVENOX) injection 30 mg  30 mg Subcutaneous Q24H Agbata, Tochukwu, MD   30 mg at 07/02/20 0100  . hydrOXYzine (ATARAX/VISTARIL) tablet 50 mg  50 mg Oral QHS Tamisha Nordstrom T, MD      . levothyroxine (SYNTHROID) tablet 100 mcg  100 mcg  Oral [id number] Lucile Shutters, MD   100 mcg at 07/02/20 0651  . magnesium sulfate IVPB 2 g 50 mL  2 g Intravenous Once Ronnald Ramp, RPH      . mirtazapine (REMERON SOL-TAB) disintegrating tablet 15 mg  15 mg Oral QHS Lexys Milliner T, MD      . nicotine (NICODERM CQ - dosed in mg/24 hours) patch 21 mg  21 mg Transdermal Daily  Agbata, Tochukwu, MD   21 mg at 07/02/20 0939  . ondansetron (ZOFRAN) tablet 4 mg  4 mg Oral Q6H PRN Agbata, Tochukwu, MD       Or  . ondansetron (ZOFRAN) injection 4 mg  4 mg Intravenous Q6H PRN Agbata, Tochukwu, MD   4 mg at 07/01/20 2231  . potassium chloride 10 mEq in 100 mL IVPB  10 mEq Intravenous Q1 Hr x 2 Ronnald Ramp, RPH      . propranolol (INDERAL) tablet 10 mg  10 mg Oral TID Krista Godsil T, MD      . QUEtiapine (SEROQUEL) tablet 200 mg  200 mg Oral QHS Judah Chevere T, MD      . tamsulosin (FLOMAX) capsule 0.4 mg  0.4 mg Oral Daily Mardelle Pandolfi T, MD      . temazepam (RESTORIL) capsule 15 mg  15 mg Oral QHS PRN Karizma Cheek, Jackquline Denmark, MD        Musculoskeletal: Strength & Muscle Tone: decreased and atrophy Gait & Station: normal Patient leans: N/A            Psychiatric Specialty Exam:  Presentation  General Appearance: No data recorded Eye Contact:No data recorded Speech:No data recorded Speech Volume:No data recorded Handedness:No data recorded  Mood and Affect  Mood:No data recorded Affect:No data recorded  Thought Process  Thought Processes:No data recorded Descriptions of Associations:No data recorded Orientation:No data recorded Thought Content:No data recorded History of Schizophrenia/Schizoaffective disorder:No data recorded Duration of Psychotic Symptoms:No data recorded Hallucinations:No data recorded Ideas of Reference:No data recorded Suicidal Thoughts:No data recorded Homicidal Thoughts:No data recorded  Sensorium  Memory:No data recorded Judgment:No data recorded Insight:No data recorded  Executive Functions  Concentration:No data recorded Attention Span:No data recorded Recall:No data recorded Fund of Knowledge:No data recorded Language:No data recorded  Psychomotor Activity  Psychomotor Activity:No data recorded  Assets  Assets:No data recorded  Sleep  Sleep:No data recorded  Physical Exam: Physical Exam Vitals and  nursing note reviewed.  Constitutional:      Appearance: He is ill-appearing.  HENT:     Head: Normocephalic and atraumatic.     Mouth/Throat:     Pharynx: Oropharynx is clear.  Eyes:     Pupils: Pupils are equal, round, and reactive to light.  Cardiovascular:     Rate and Rhythm: Normal rate and regular rhythm.  Pulmonary:     Effort: Pulmonary effort is normal.     Breath sounds: Normal breath sounds.  Abdominal:     General: Abdomen is flat.     Palpations: Abdomen is soft.  Musculoskeletal:        General: Normal range of motion.  Skin:    General: Skin is warm and dry.  Neurological:     General: No focal deficit present.     Mental Status: He is alert. Mental status is at baseline.  Psychiatric:        Attention and Perception: Attention normal.        Mood and Affect: Mood normal. Affect is blunt.  Speech: Speech is delayed.        Behavior: Behavior is slowed.        Thought Content: Thought content normal. Thought content is not paranoid. Thought content does not include homicidal or suicidal ideation.        Cognition and Memory: Cognition normal.        Judgment: Judgment normal.    Review of Systems  Constitutional: Positive for weight loss.  HENT: Negative.   Eyes: Negative.   Respiratory: Negative.   Cardiovascular: Negative.   Gastrointestinal: Negative.        Patient's chief complaint is an inability to swallow  Musculoskeletal: Negative.   Skin: Negative.   Neurological: Negative.   Psychiatric/Behavioral: Negative.    Blood pressure 114/76, pulse 64, temperature 98.4 F (36.9 C), temperature source Oral, resp. rate 15, height 5' 9.5" (1.765 m), weight 48.4 kg, SpO2 100 %. Body mass index is 15.52 kg/m.  Treatment Plan Summary: Medication management and Plan Reviewed medications.  From a psychiatric standpoint everything looks pretty normal and appropriate.  It is a lower dose of Tegretol and Seroquel than might usually be expected but the  group home was pretty clear about it and if he is also on the Haldol Decanoate that makes sense.  Seems like he must be in a better environment than he used to be.  I have just updated his medication orders based on what they quoted to me.  I have also added a as needed for Restoril at night as the patient says he did not sleep last night and is very nervous that that will happen again.  I would check up over the weekend.  Disposition: No evidence of imminent risk to self or others at present.   Patient does not meet criteria for psychiatric inpatient admission. Supportive therapy provided about ongoing stressors.  Mordecai Rasmussen, MD 07/02/2020 10:56 AM

## 2020-07-02 NOTE — Progress Notes (Signed)
Explained and discussed endoscopy findings to patient. Report called to [facility] RN regarding findings. Patient returning to room via transport.

## 2020-07-02 NOTE — Transfer of Care (Signed)
Immediate Anesthesia Transfer of Care Note  Patient: Jaime Vargas  Procedure(s) Performed: ESOPHAGOGASTRODUODENOSCOPY (EGD) WITH PROPOFOL (N/A )  Patient Location: PACU  Anesthesia Type:General  Level of Consciousness: drowsy and patient cooperative  Airway & Oxygen Therapy: Patient Spontanous Breathing  Post-op Assessment: Report given to RN and Post -op Vital signs reviewed and stable  Post vital signs: Reviewed and stable  Last Vitals:  Vitals Value Taken Time  BP 113/83 07/02/20 1307  Temp 36.2 C 07/02/20 1307  Pulse 73 07/02/20 1307  Resp 15 07/02/20 1309  SpO2      Last Pain:  Vitals:   07/02/20 1307  TempSrc: Temporal  PainSc: 0-No pain         Complications: No complications documented.

## 2020-07-02 NOTE — NC FL2 (Signed)
[place] MEDICAID FL2 LEVEL OF CARE SCREENING TOOL     IDENTIFICATION  Patient Name: Jaime Vargas Birthdate: Jul 01, 1965 Sex: male Admission Date (Current Location): 07/01/2020  [place] and [place] Number:  Chiropodist and Address:  [facility], [address] [place], [place] [address]      Provider Number: [id number]  Attending Physician Name and Address:  Delfino Lovett, MD  Relative Name and Phone Number:  [name] (brother) [phone]    Current Level of Care: Hospital Recommended Level of Care: Skilled Nursing Facility Prior Approval Number:    Date Approved/Denied:   PASRR Number: [id number]  Discharge Plan: SNF    Current Diagnoses: Patient Active Problem List   Diagnosis Date Noted  . Protein-calorie malnutrition, severe 07/02/2020  . AKI (acute kidney injury) (HCC) 07/01/2020  . Dysphagia 07/01/2020  . Hypothyroidism 07/01/2020  . Tobacco use disorder 10/20/2014  . Schizoaffective disorder, bipolar type (HCC) 10/19/2014  . Traumatic brain injury (HCC) 10/19/2014    Orientation RESPIRATION BLADDER Height & Weight     Self,Time,Situation,Place  Normal Continent Weight: 107 lb (48.5 kg) Height:  5' 9.5" (176.5 cm)  BEHAVIORAL SYMPTOMS/MOOD NEUROLOGICAL BOWEL NUTRITION STATUS      Continent Diet (see discharge summary)  AMBULATORY STATUS COMMUNICATION OF NEEDS Skin   Limited Assist Verbally Normal                       Personal Care Assistance Level of Assistance  Bathing,Feeding,Dressing,Total care Bathing Assistance: Limited assistance Feeding assistance: Independent Dressing Assistance: Limited assistance Total Care Assistance: Limited assistance   Functional Limitations Info  Sight,Hearing,Speech Sight Info: Adequate Hearing Info: Adequate Speech Info: Adequate    SPECIAL CARE FACTORS FREQUENCY  PT (By licensed PT),OT (By licensed OT)     PT Frequency: min 4x weekly OT Frequency: min 4x weekly             Contractures Contractures Info: Not present    Additional Factors Info  Code Status,Allergies Code Status Info: full Allergies Info: No [place] Allergies           Current Medications (07/02/2020):  This is the current hospital active medication list Current Facility-Administered Medications  Medication Dose Route Frequency Provider Last Rate Last Admin  . 0.9 % NaCl with KCl 40 mEq / L  infusion   Intravenous Continuous Agbata, Tochukwu, MD 125 mL/hr at 07/02/20 1246 125 mL/hr at 07/02/20 1246  . benztropine (COGENTIN) tablet 0.5 mg  0.5 mg Oral Daily Clapacs, John T, MD      . carbamazepine (TEGRETOL) tablet 200 mg  200 mg Oral QHS Clapacs, John T, MD      . emtricitabine-tenofovir AF (DESCOVY) 200-25 MG per tablet 1 tablet  1 tablet Oral Daily Sherryll Burger, Vipul, MD      . enoxaparin (LOVENOX) injection 30 mg  30 mg Subcutaneous Q24H Agbata, Tochukwu, MD   30 mg at 07/02/20 0100  . feeding supplement (ENSURE ENLIVE / ENSURE PLUS) liquid 237 mL  237 mL Oral TID BM Sherryll Burger, Vipul, MD      . hydrOXYzine (ATARAX/VISTARIL) tablet 50 mg  50 mg Oral QHS Clapacs, John T, MD      . levothyroxine (SYNTHROID) tablet 100 mcg  100 mcg Oral Q0600 Agbata, Tochukwu, MD   100 mcg at 07/02/20 0651  . magnesium sulfate IVPB 2 g 50 mL  2 g Intravenous Once Ronnald Ramp, RPH      . mirtazapine (REMERON SOL-TAB) disintegrating tablet  15 mg  15 mg Oral QHS Clapacs, John T, MD      . multivitamin with minerals tablet 1 tablet  1 tablet Oral Daily Sherryll Burger, Vipul, MD      . nicotine (NICODERM CQ - dosed in mg/24 hours) patch 21 mg  21 mg Transdermal Daily Agbata, Tochukwu, MD   21 mg at 07/02/20 0939  . ondansetron (ZOFRAN) tablet 4 mg  4 mg Oral Q6H PRN Agbata, Tochukwu, MD       Or  . ondansetron (ZOFRAN) injection 4 mg  4 mg Intravenous Q6H PRN Agbata, Tochukwu, MD   4 mg at 07/01/20 2231  . pantoprazole (PROTONIX) injection 40 mg  40 mg Intravenous Q12H Vanga, Loel Dubonnet, MD      . potassium chloride 10  mEq in 100 mL IVPB  10 mEq Intravenous Q1 Hr x 2 Ronnald Ramp, RPH      . propranolol (INDERAL) tablet 10 mg  10 mg Oral TID Clapacs, John T, MD      . QUEtiapine (SEROQUEL) tablet 200 mg  200 mg Oral QHS Clapacs, John T, MD      . sucralfate (CARAFATE) 1 GM/10ML suspension 1 g  1 g Oral TID WC & HS Vanga, Loel Dubonnet, MD      . tamsulosin (FLOMAX) capsule 0.4 mg  0.4 mg Oral Daily Clapacs, John T, MD      . temazepam (RESTORIL) capsule 15 mg  15 mg Oral QHS PRN Clapacs, Jackquline Denmark, MD         Discharge Medications: Please see discharge summary for a list of discharge medications.  Relevant Imaging Results:  Relevant Lab Results:   Additional Information [id number]  Gildardo Griffes, LCSW

## 2020-07-02 NOTE — Consult Note (Signed)
PHARMACY CONSULT NOTE - FOLLOW UP  Pharmacy Consult for Electrolyte Monitoring and Replacement   Recent Labs: Potassium (mmol/L)  Date Value  07/02/2020 2.6 (LL)   Calcium (mg/dL)  Date Value  04/54/0981 7.8 (L)   Albumin (g/dL)  Date Value  19/14/7829 4.5   Sodium (mmol/L)  Date Value  07/02/2020 138     Assessment: 55 y.o. male with medical history significant for traumatic brain injury, schizoaffective disorder, remote history of tracheostomy status post decannulation who presents to the emergency room from the rest home where he resides for evaluation of dysphagia for solids and significant weight loss.    Goal of Therapy:  WNL  Plan:  Pt is currently on NS with KCl @ 125 ml/hr. Pt also received KCl PO @ 0651 on 6/3.  Will order a potassium level at 1800 to ensure potassium trending up and no adjustments are needed with fluids.   Ronnald Ramp ,PharmD Clinical Pharmacist 07/02/2020 9:00 AM

## 2020-07-02 NOTE — Op Note (Signed)
[facility] Gastroenterology Patient Name: Jaime Vargas Procedure Date: 07/02/2020 12:32 PM MRN: [id number] Account #: [id number] Date of Birth: 09-14-1965 Admit Type: Outpatient Age: [age] [facility]: [facility] END[facility] ROOM 2 Gender: Male Note Status: Finalized Procedure:             Upper GI endoscopy Indications:           Esophageal dysphagia, Anorexia, Failure to thrive,                         Weight loss Providers:             Toney Reilohini Reddy Airam Runions MD, MD Referring MD:          No Local Md, MD (Referring MD) Medicines:             General Anesthesia Complications:         No immediate complications. Estimated blood loss: None. Procedure:             Pre-Anesthesia Assessment:                        - Prior to the procedure, a History and Physical was                         performed, and patient medications and allergies were                         reviewed. The patient is competent. The risks and                         benefits of the procedure and the sedation options and                         risks were discussed with the patient. All questions                         were answered and informed consent was obtained.                         Patient identification and proposed procedure were                         verified by the physician, the nurse, the                         anesthesiologist, the anesthetist and the technician                         in the pre-procedure area in the procedure room in the                         endoscopy suite. Mental Status Examination: alert and                         oriented. Airway Examination: normal oropharyngeal                         airway and neck mobility. Respiratory Examination:  clear to auscultation. CV Examination: normal.                         Prophylactic Antibiotics: The patient does not require                         prophylactic antibiotics. Prior Anticoagulants: The                          patient has taken no previous anticoagulant or                         antiplatelet agents. ASA Grade Assessment: III - A                         patient with severe systemic disease. After reviewing                         the risks and benefits, the patient was deemed in                         satisfactory condition to undergo the procedure. The                         anesthesia plan was to use general anesthesia.                         Immediately prior to administration of medications,                         the patient was re-assessed for adequacy to receive                         sedatives. The heart rate, respiratory rate, oxygen                         saturations, blood pressure, adequacy of pulmonary                         ventilation, and response to care were monitored                         throughout the procedure. The physical status of the                         patient was re-assessed after the procedure.                        After obtaining informed consent, the endoscope was                         passed under direct vision. Throughout the procedure,                         the patient's blood pressure, pulse, and oxygen                         saturations were monitored continuously. The Endoscope  was introduced through the mouth, and advanced to the                         third part of duodenum. The upper GI endoscopy was                         accomplished without difficulty. The patient tolerated                         the procedure well. Findings:      Many non-bleeding superficial duodenal ulcers with a clean ulcer base       (Forrest Class III) were found in the duodenal bulb and in the second       portion of the duodenum. The largest lesion was 3 mm in largest       dimension.      Multiple dispersed diminutive erosions with stigmata of recent bleeding       were found in the gastric body. Biopsies were taken with a  cold forceps       for Helicobacter pylori testing.      The incisura and gastric antrum were normal. Biopsies were taken with a       cold forceps for Helicobacter pylori testing.      A small hiatal hernia was present.      Esophagogastric landmarks were identified: the gastroesophageal junction       was found at 38 cm from the incisors.      LA Grade D (one or more mucosal breaks involving at least 75% of       esophageal circumference) esophagitis with no bleeding was found in the       lower third of the esophagus.      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 Impression:            - Non-bleeding duodenal ulcers with a clean ulcer base                         (Forrest Class III).                        - Erosive gastropathy with stigmata of recent                         bleeding. Biopsied.                        - Normal incisura and antrum. Biopsied.                        - Small hiatal hernia.                        - Esophagogastric landmarks identified.                        - LA Grade D reflux esophagitis with no bleeding.                        - Ectopic gastric mucosa in the upper third of the  esophagus. Recommendation:        - Return patient to hospital ward for ongoing care.                        - Mechanical soft diet.                        - Continue present medications.                        - Await pathology results.                        - Use Prilosec (omeprazole) 40 mg PO BID for 3 months.                        - Repeat upper endoscopy in 3 months to check healing.                        - Return to GI office in 2 months. Procedure Code(s):     --- Professional ---                        [phone], Esophagogastroduodenoscopy, flexible,                         transoral; with biopsy, single or multiple Diagnosis Code(s):     --- Professional ---                        K26.9,  Duodenal ulcer, unspecified as acute or                         chronic, without hemorrhage or perforation                        K92.2, Gastrointestinal hemorrhage, unspecified                        K44.9, Diaphragmatic hernia without obstruction or                         gangrene                        K21.00, Gastro-esophageal reflux disease with                         esophagitis, without bleeding                        Q40.2, Other specified congenital malformations of                         stomach                        R13.14, Dysphagia, pharyngoesophageal phase                        R63.0, Anorexia                        R62.7, Adult failure to  thrive                        R63.4, Abnormal weight loss CPT copyright 2019 American Medical Association. All rights reserved. The codes documented in this report are preliminary and upon coder review may  be revised to meet current compliance requirements. Dr. Libby Maw Toney Reil MD, MD 07/02/2020 1:07:29 PM This report has been signed electronically. Number of Addenda: 0 Note Initiated On: 07/02/2020 12:32 PM Estimated Blood Loss:  Estimated blood loss: none. Estimated blood loss: none.      [facility]

## 2020-07-02 NOTE — TOC Initial Note (Signed)
Transition of Care [facility]) - Initial/Assessment Note    Patient Details  Name: Jaime Vargas MRN: [id number] Date of Birth: 1965/09/05  Transition of Care [facility]) CM/SW Contact:    Gildardo Griffes, LCSW Phone Number: 07/02/2020, 2:20 PM  Clinical Narrative:                  CSW spoke with patient regarding recommendation of SNF at time of discharge. He reports he is agreeable to SNF, as he's currently at [facility].   CSW spoke with [facility], they report the closes SNF to them is [facility]. Patient reports he's agreeable to [facility] as preference.   CSW has sent referral pending bed offer at this time.   Expected Discharge Plan: Skilled Nursing Facility Barriers to Discharge: Continued Medical Work up   Patient Goals and CMS Choice Patient states their goals for this hospitalization and ongoing recovery are:: to go to SNF CMS Medicare.gov Compare Post Acute Care list provided to:: Patient Choice offered to / list presented to : Patient  Expected Discharge Plan and Services Expected Discharge Plan: Skilled Nursing Facility     Post Acute Care Choice: Skilled Nursing Facility Living arrangements for the past 2 months: Assisted Living Facility [facility])                                      Prior Living Arrangements/Services Living arrangements for the past 2 months: Assisted Living Facility [facility]) Lives with:: Self Patient language and need for interpreter reviewed:: Yes Do you feel safe going back to the place where you live?: Yes      Need for Family Participation in Patient Care: Yes (Comment) Care giver support system in place?: Yes (comment)   Criminal Activity/Legal Involvement Pertinent to Current Situation/Hospitalization: No - Comment as needed  Activities of Daily Living Home Assistive Devices/Equipment: None ADL Screening (condition at time of admission) Patient's cognitive  ability adequate to safely complete daily activities?: Yes Is the patient deaf or have difficulty hearing?: No Does the patient have difficulty seeing, even when wearing glasses/contacts?: No Does the patient have difficulty concentrating, remembering, or making decisions?: No Patient able to express need for assistance with ADLs?: Yes Does the patient have difficulty dressing or bathing?: No Independently performs ADLs?: Yes (appropriate for developmental age) Does the patient have difficulty walking or climbing stairs?: No Weakness of Legs: None Weakness of Arms/Hands: None  Permission Sought/Granted Permission sought to share information with : Case Manager,Facility Contact Representative,Family Supports Permission granted to share information with : Yes, Verbal Permission Granted  Share Information with NAME: [name]  Permission granted to share info w AGENCY: SNFs  Permission granted to share info w Relationship: brother  Permission granted to share info w Contact Information: [phone]  Emotional Assessment   Attitude/Demeanor/Rapport: Gracious Affect (typically observed): Calm Orientation: : Oriented to Self,Oriented to Place,Oriented to  Time,Oriented to Situation Alcohol / Substance Use: Not Applicable Psych Involvement: No (comment)  Admission diagnosis:  Other dysphagia [R13.19] AKI (acute kidney injury) (HCC) [N17.9] Abdominal pain [R10.9] Patient Active Problem List   Diagnosis Date Noted  . Protein-calorie malnutrition, severe 07/02/2020  . AKI (acute kidney injury) (HCC) 07/01/2020  . Dysphagia 07/01/2020  . Hypothyroidism 07/01/2020  . Tobacco use disorder 10/20/2014  . Schizoaffective disorder, bipolar type (HCC) 10/19/2014  . Traumatic brain injury (HCC) 10/19/2014  PCP:  [facility] Pharmacy:   [facility] [phone] - [place] - [address] [facility] [place] [address] Phone:  [phone] Fax: [phone]     Social Determinants of Health (SDOH) Interventions    Readmission Risk Interventions No flowsheet data found.

## 2020-07-03 LAB — CBC
HCT: 24.2 % — ABNORMAL LOW (ref 39.0–52.0)
Hemoglobin: 8.9 g/dL — ABNORMAL LOW (ref 13.0–17.0)
MCH: 34.9 pg — ABNORMAL HIGH (ref 26.0–34.0)
MCHC: 36.8 g/dL — ABNORMAL HIGH (ref 30.0–36.0)
MCV: 94.9 fL (ref 80.0–100.0)
Platelets: 139 10*3/uL — ABNORMAL LOW (ref 150–400)
RBC: 2.55 MIL/uL — ABNORMAL LOW (ref 4.22–5.81)
RDW: 13.8 % (ref 11.5–15.5)
WBC: 5.9 10*3/uL (ref 4.0–10.5)
nRBC: 0 % (ref 0.0–0.2)

## 2020-07-03 LAB — BASIC METABOLIC PANEL
Anion gap: 1 — ABNORMAL LOW (ref 5–15)
BUN: 11 mg/dL (ref 6–20)
CO2: 17 mmol/L — ABNORMAL LOW (ref 22–32)
Calcium: 7.8 mg/dL — ABNORMAL LOW (ref 8.9–10.3)
Chloride: 125 mmol/L — ABNORMAL HIGH (ref 98–111)
Creatinine, Ser: 2.43 mg/dL — ABNORMAL HIGH (ref 0.61–1.24)
GFR, Estimated: 31 mL/min — ABNORMAL LOW (ref >60)
Glucose, Bld: 97 mg/dL (ref 70–99)
Potassium: 3.5 mmol/L (ref 3.5–5.1)
Sodium: 143 mmol/L (ref 135–145)

## 2020-07-03 LAB — MAGNESIUM: Magnesium: 2 mg/dL (ref 1.7–2.4)

## 2020-07-03 NOTE — Consult Note (Signed)
PHARMACY CONSULT NOTE - FOLLOW UP  Pharmacy Consult for Electrolyte Monitoring and Replacement   Recent Labs: Potassium (mmol/L)  Date Value  07/03/2020 3.5   Magnesium (mg/dL)  Date Value  17/51/0258 2.0   Calcium (mg/dL)  Date Value  52/77/8242 7.8 (L)   Albumin (g/dL)  Date Value  35/36/1443 4.5   Sodium (mmol/L)  Date Value  07/03/2020 143     Assessment: 55 y.o. male with medical history significant for traumatic brain injury, schizoaffective disorder, remote history of tracheostomy status post decannulation who presents to the emergency room from the rest home where he resides for evaluation of severe dysphagia for solids and significant weight loss over 1 to 2 weeks.    Mg 2.0, K 3.5, Scr improving   Goal of Therapy:  Electrolytes WNL  Plan:  Continue NS with KCl @ 125 ml/hr.  Mg, Phos, and K labs in am  Albina Billet ,PharmD Clinical Pharmacist 07/03/2020 9:57 AM

## 2020-07-03 NOTE — Progress Notes (Addendum)
This RN informedat 1355 by nursing student Anuli Akpu that morning medications were given including profanolol with a heart rate of 49 at 1047 this morning. [place] Instructor gave all medications with student without RN being present. Student was educated by this RN to hold beta blockers when heart rate is less than 65 bpm. This RN rechecked vitals upon being informed and HR is currently 56. Patient has no complaints at this time and all needs are met. Will continue to monitor patient closely and inform CCMD to set lower limit for heart rate to 50 and notify RN if low limit occurs.

## 2020-07-03 NOTE — Progress Notes (Signed)
PROGRESS NOTE  Jaime Vargas QMG:[id number] DOB: Jan 03, 1966 DOA: 07/01/2020 PCP: [facility]  HPI/Recap of past 24 hours: Patient seen and examined at bedside he said he did well overnight except he has not been sleeping well because they did not give him his Seroquel.  Review of his medical medication shows that he was given Seroquel at 3:00 AM  Assessment/Plan: Principal Problem:   Schizoaffective disorder, bipolar type (HCC) Active Problems:   Tobacco use disorder   AKI (acute kidney injury) (HCC)   Dysphagia   Hypothyroidism   Protein-calorie malnutrition, severe  #1 acute kidney injury secondary to dysphagia with dehydration due to poor oral intake patient has received IV bolus his baseline creatinine is improving but slightly continue IV hydration and monitoring his renal picture  2.  Dysphagia EGD on July 02, 2020 showing severe erosive esophagitis duodenal ulcer and gastric erosion.  Improved continue Carafate recommendation is to discharge him on Prilosec 40 mg twice daily instead of Protonix for outpatient follow-up  3.  Hypothyroidism continue current Synthroid  4.  Schizophrenia continue Seroquel hydroxyzine, Tegretol, Remeron  5.  Protein malnutrition Ensure supplement  6.  Insomnia continue as needed Restoril for sleep Code Status: Full  Severity of Illness: The appropriate patient status for this patient is INPATIENT. Inpatient status is judged to be reasonable and necessary in order to provide the required intensity of service to ensure the patient's safety. The patient's presenting symptoms, physical exam findings, and initial radiographic and laboratory data in the context of their chronic comorbidities is felt to place them at high risk for further clinical deterioration. Furthermore, it is not anticipated that the patient will be medically stable for discharge from the hospital within 2 midnights of admission. The following factors support the  patient status of inpatient.   " Patient admitted with significant weight loss and dehydration s acute kidney injury due to dehydration   * I certify that at the point of admission it is my clinical judgment that the patient will require inpatient hospital care spanning beyond 2 midnights from the point of admission due to high intensity of service, high risk for further deterioration and high frequency of surveillance required.*    Family Communication: Patient  Disposition Plan: Home when stable   Consultants:  Gastroenterology  Anesthesiology  Procedures:  Upper endoscopy July 02, 2020  Antimicrobials:  None  DVT prophylaxis: Lovenox   Objective: Vitals:   07/03/20 0421 07/03/20 0726 07/03/20 0729 07/03/20 1026  BP: 108/71 98/71 103/71 111/71  Pulse: 62 (!) 57 (!) 58 (!) 49  Resp: 17  18   Temp: 97.6 F (36.4 C) 97.7 F (36.5 C) 97.7 F (36.5 C) 97.9 F (36.6 C)  TempSrc: Oral Oral Oral   SpO2: 100% 100% 100% 100%  Weight:      Height:        Intake/Output Summary (Last 24 hours) at 07/03/2020 1039 Last data filed at 07/03/2020 0801 Gross per 24 hour  Intake 2853.76 ml  Output 250 ml  Net 2603.76 ml   Filed Weights   07/01/20 1448 07/02/20 0036 07/02/20 1241  Weight: 48.5 kg 48.4 kg 48.5 kg   Body mass index is 15.57 kg/m.  Exam:  . General: 55 y.o. year-old male well developed well nourished in no acute distress.  Alert and oriented x3. . Cardiovascular: Regular rate and rhythm with no rubs or gallops.  No thyromegaly or JVD noted.   Marland Kitchen Respiratory: Clear to auscultation with no wheezes  or rales. Good inspiratory effort. . Abdomen: Soft nontender nondistended with normal bowel sounds x4 quadrants. . Musculoskeletal: No lower extremity edema. 2/4 pulses in all 4 extremities. . Skin: No ulcerative lesions noted or rashes, . Psychiatry: Mood is appropriate for condition and setting    Data Reviewed: CBC: Recent Labs  Lab 07/01/20 1450  07/02/20 0526 07/03/20 0502  WBC 10.1 8.4 5.9  HGB 13.5 10.6* 8.9*  HCT RESULTS UNAVAILABLE DUE TO INTERFERING SUBSTANCE Unable to determine due to a cold agglutinin 24.2*  MCV RESULTS UNAVAILABLE DUE TO INTERFERING SUBSTANCE Unable to determine due to a cold agglutinin 94.9  PLT 191 165 139*   Basic Metabolic Panel: Recent Labs  Lab 07/01/20 1450 07/02/20 0526 07/02/20 0941 07/02/20 1118 07/02/20 1711 07/03/20 0502  NA 132* 138  --   --   --  143  K 3.0* 2.6* 2.8* 3.0* 3.0* 3.5  CL 107 116*  --   --   --  125*  CO2 17* 17*  --   --   --  17*  GLUCOSE 93 84  --   --   --  97  BUN 19 17  --   --   --  11  CREATININE 3.47* 3.07*  --   --   --  2.43*  CALCIUM 8.5* 7.8*  --   --   --  7.8*  MG  --   --   --  1.9  --  2.0   GFR: Estimated Creatinine Clearance: 24.1 mL/min (A) (by C-G formula based on SCr of 2.43 mg/dL (H)). Liver Function Tests: No results for input(s): AST, ALT, ALKPHOS, BILITOT, PROT, ALBUMIN in the last 168 hours. No results for input(s): LIPASE, AMYLASE in the last 168 hours. No results for input(s): AMMONIA in the last 168 hours. Coagulation Profile: No results for input(s): INR, PROTIME in the last 168 hours. Cardiac Enzymes: No results for input(s): CKTOTAL, CKMB, CKMBINDEX, TROPONINI in the last 168 hours. BNP (last 3 results) No results for input(s): PROBNP in the last 8760 hours. HbA1C: No results for input(s): HGBA1C in the last 72 hours. CBG: No results for input(s): GLUCAP in the last 168 hours. Lipid Profile: No results for input(s): CHOL, HDL, LDLCALC, TRIG, CHOLHDL, LDLDIRECT in the last 72 hours. Thyroid Function Tests: No results for input(s): TSH, T4TOTAL, FREET4, T3FREE, THYROIDAB in the last 72 hours. Anemia Panel: Recent Labs    07/02/20 1711  VITAMINB12 347  FOLATE 8.1  FERRITIN 342*  TIBC 182*  IRON 165   Urine analysis:    Component Value Date/Time   COLORURINE YELLOW (A) 11/05/2014 1939   APPEARANCEUR CLEAR (A)  11/05/2014 1939   LABSPEC 1.012 11/05/2014 1939   PHURINE 6.0 11/05/2014 1939   GLUCOSEU NEGATIVE 11/05/2014 1939   HGBUR NEGATIVE 11/05/2014 1939   BILIRUBINUR NEGATIVE 11/05/2014 1939   KETONESUR NEGATIVE 11/05/2014 1939   PROTEINUR NEGATIVE 11/05/2014 1939   NITRITE NEGATIVE 11/05/2014 1939   LEUKOCYTESUR NEGATIVE 11/05/2014 1939   Sepsis Labs: @LABRCNTIP (procalcitonin:4,lacticidven:4)  ) Recent Results (from the past 240 hour(s))  SARS CORONAVIRUS 2 (TAT 6-24 HRS) Nasopharyngeal Nasopharyngeal Swab     Status: None   Collection Time: 07/01/20  6:54 PM   Specimen: Nasopharyngeal Swab  Result Value Ref Range Status   SARS Coronavirus 2 NEGATIVE NEGATIVE Final    Comment: (NOTE) SARS-CoV-2 target nucleic acids are NOT DETECTED.  The SARS-CoV-2 RNA is generally detectable in upper and lower respiratory specimens during the acute phase of  infection. Negative results do not preclude SARS-CoV-2 infection, do not rule out co-infections with other pathogens, and should not be used as the sole basis for treatment or other patient management decisions. Negative results must be combined with clinical observations, patient history, and epidemiological information. The expected result is Negative.  Fact Sheet for Patients: HairSlick.no  Fact Sheet for Healthcare Providers: quierodirigir.com  This test is not yet approved or cleared by the [place] FDA and  has been authorized for detection and/or diagnosis of SARS-CoV-2 by FDA under an Emergency Use Authorization (EUA). This EUA will remain  in effect (meaning this test can be used) for the duration of the COVID-19 declaration under Se ction 564(b)(1) of the Act, 21 U.S.C. section 360bbb-3(b)(1), unless the authorization is terminated or revoked sooner.  Performed at [facility], [address] [place], [place] [address]       Studies: No results  found.  Scheduled Meds: . benztropine  0.5 mg Oral Daily  . carbamazepine  200 mg Oral QHS  . emtricitabine-tenofovir AF  1 tablet Oral Daily  . enoxaparin (LOVENOX) injection  30 mg Subcutaneous Q24H  . feeding supplement  237 mL Oral TID BM  . hydrOXYzine  50 mg Oral QHS  . levothyroxine  100 mcg Oral Q0600  . mirtazapine  15 mg Oral QHS  . multivitamin with minerals  1 tablet Oral Daily  . nicotine  21 mg Transdermal Daily  . pantoprazole (PROTONIX) IV  40 mg Intravenous Q12H  . propranolol  10 mg Oral TID  . QUEtiapine  200 mg Oral QHS  . sucralfate  1 g Oral TID WC & HS  . tamsulosin  0.4 mg Oral Daily    Continuous Infusions: . 0.9 % NaCl with KCl 40 mEq / L 125 mL/hr at 07/03/20 0801     LOS: 2 days     Myrtie Neither, MD [facility]  To reach me or the doctor on call, go to: [email or web address] Password [facility]  07/03/2020, 10:39 AM

## 2020-07-04 LAB — BASIC METABOLIC PANEL
Anion gap: 2 — ABNORMAL LOW (ref 5–15)
BUN: 11 mg/dL (ref 6–20)
CO2: 15 mmol/L — ABNORMAL LOW (ref 22–32)
Calcium: 7.8 mg/dL — ABNORMAL LOW (ref 8.9–10.3)
Chloride: 128 mmol/L — ABNORMAL HIGH (ref 98–111)
Creatinine, Ser: 2.12 mg/dL — ABNORMAL HIGH (ref 0.61–1.24)
GFR, Estimated: 37 mL/min — ABNORMAL LOW (ref >60)
Glucose, Bld: 89 mg/dL (ref 70–99)
Potassium: 3.8 mmol/L (ref 3.5–5.1)
Sodium: 145 mmol/L (ref 135–145)

## 2020-07-04 LAB — CBC
HCT: 24.9 % — ABNORMAL LOW (ref 39.0–52.0)
Hemoglobin: 9 g/dL — ABNORMAL LOW (ref 13.0–17.0)
MCH: 35 pg — ABNORMAL HIGH (ref 26.0–34.0)
MCHC: 36.1 g/dL — ABNORMAL HIGH (ref 30.0–36.0)
MCV: 96.9 fL (ref 80.0–100.0)
Platelets: 144 10*3/uL — ABNORMAL LOW (ref 150–400)
RBC: 2.57 MIL/uL — ABNORMAL LOW (ref 4.22–5.81)
RDW: 14.6 % (ref 11.5–15.5)
WBC: 8.9 10*3/uL (ref 4.0–10.5)
nRBC: 0 % (ref 0.0–0.2)

## 2020-07-04 LAB — MAGNESIUM: Magnesium: 1.6 mg/dL — ABNORMAL LOW (ref 1.7–2.4)

## 2020-07-04 LAB — PHOSPHORUS: Phosphorus: 1.4 mg/dL — ABNORMAL LOW (ref 2.5–4.6)

## 2020-07-04 MED ORDER — POTASSIUM CHLORIDE IN NACL 40-0.9 MEQ/L-% IV SOLN
INTRAVENOUS | Status: DC
  Filled 2020-07-04 (×5): qty 1000

## 2020-07-04 MED ORDER — K PHOS MONO-SOD PHOS DI & MONO 155-852-130 MG PO TABS
250.0000 mg | ORAL_TABLET | Freq: Three times a day (TID) | ORAL | Status: AC
  Administered 2020-07-04 (×3): 250 mg via ORAL
  Filled 2020-07-04 (×3): qty 1

## 2020-07-04 MED ORDER — POTASSIUM CHLORIDE IN NACL 40-0.9 MEQ/L-% IV SOLN: INTRAVENOUS | Status: DC

## 2020-07-04 MED ORDER — SODIUM CHLORIDE 0.9 % IV SOLN
INTRAVENOUS | Status: DC
  Filled 2020-07-04 (×3): qty 1000

## 2020-07-04 MED ORDER — SODIUM CHLORIDE 0.9 % IV BOLUS
500.0000 mL | Freq: Once | INTRAVENOUS | Status: AC
  Administered 2020-07-04: 500 mL via INTRAVENOUS

## 2020-07-04 MED ORDER — MAGNESIUM SULFATE 2 GM/50ML IV SOLN
2.0000 g | Freq: Once | INTRAVENOUS | Status: AC
  Administered 2020-07-04: 2 g via INTRAVENOUS
  Filled 2020-07-04: qty 50

## 2020-07-04 NOTE — Progress Notes (Addendum)
pts' bp 88/59 MAP 69 asymptomatic/MD  Made aware, order placed for NS bolus once. Will recheck BP after bolus completion. Will continue to monitor.    UPDATE 1236: pt's BP improved to 99/63 MAP 75/MD made aware/continue current IVF at current rate. Will continue to monitor.

## 2020-07-04 NOTE — Consult Note (Signed)
PHARMACY CONSULT NOTE - FOLLOW UP  Pharmacy Consult for Electrolyte Monitoring and Replacement   Recent Labs: Potassium (mmol/L)  Date Value  07/04/2020 3.8   Magnesium (mg/dL)  Date Value  24/58/0998 1.6 (L)   Calcium (mg/dL)  Date Value  33/82/5053 7.8 (L)   Albumin (g/dL)  Date Value  97/67/3419 4.5   Phosphorus (mg/dL)  Date Value  37/90/2409 1.4 (L)   Sodium (mmol/L)  Date Value  07/04/2020 145     Assessment: 55 y.o. male with medical history significant for traumatic brain injury, schizoaffective disorder, remote history of tracheostomy status post decannulation who presents to the emergency room from the rest home where he resides for evaluation of severe dysphagia for solids and significant weight loss over 1 to 2 weeks.    Mg 1.6, K 3.8, Phos 1.4 Scr improving   Goal of Therapy:  Electrolytes WNL  Plan:  Continue NS with KCl @ 125 ml/hr. Will replete Mg with 2g IV bolus Will replete phos with KPhos tab 250mg  x 3  Mg, Phos, and K labs in am  ,PharmD Clinical Pharmacist 07/04/2020 9:33 AM

## 2020-07-04 NOTE — Progress Notes (Signed)
PROGRESS NOTE  Jaime Vargas UMP:[id number] DOB: [birth year] DOA: 07/01/2020 PCP: [facility]  HPI/Recap of past 24 hours: Patient seen and examined at bedside he said he did well overnight except he has not been sleeping well because they did not give him his Seroquel.  Review of his medical medication shows that he was given Seroquel at 3:00 AM  Update July 04, 2020: Patient seen and examined at bedside he complained that he was still having nausea and some dysphagia.  Patient was noted to have low blood pressure and was given IV fluid bolus   Assessment/Plan: Principal Problem:   Schizoaffective disorder, bipolar type (HCC) Active Problems:   Tobacco use disorder   AKI (acute kidney injury) (HCC)   Dysphagia   Hypothyroidism   Protein-calorie malnutrition, severe  #1 acute kidney injury secondary to dysphagia with dehydration due to poor oral intake patient has received IV bolus his baseline creatinine is improving but slightly continue IV hydration and monitoring his renal picture today his creatinine is 2.1.  We will continue to monitor continue IV hydration he received a bolus of 500 mils of normal saline due to hypotension  2.  Dysphagia EGD on July 02, 2020 showing severe erosive esophagitis duodenal ulcer and gastric erosion.  Improved continue Carafate recommendation is to discharge him on Prilosec 40 mg twice daily instead of Protonix for outpatient follow-up  3.  Hypothyroidism continue current Synthroid  4.  Schizophrenia continue Seroquel hydroxyzine, Tegretol, Remeron  5.  Protein malnutrition Ensure supplement  6.  Insomnia continue as needed Restoril for sleep  7.  Transient hypotension patient received 500 cc of normal saline with improvement we will continue IV hydration Code Status: Full  Severity of Illness: The appropriate patient status for this patient is INPATIENT. Inpatient status is judged to be reasonable and necessary in order to  provide the required intensity of service to ensure the patient's safety. The patient's presenting symptoms, physical exam findings, and initial radiographic and laboratory data in the context of their chronic comorbidities is felt to place them at high risk for further clinical deterioration. Furthermore, it is not anticipated that the patient will be medically stable for discharge from the hospital within 2 midnights of admission. The following factors support the patient status of inpatient.   " Patient admitted with significant weight loss and dehydration s acute kidney injury due to dehydration   * I certify that at the point of admission it is my clinical judgment that the patient will require inpatient hospital care spanning beyond 2 midnights from the point of admission due to high intensity of service, high risk for further deterioration and high frequency of surveillance required.*    Family Communication: Patient  Disposition Plan: Home when stable   Consultants:  Gastroenterology  Anesthesiology  Procedures:  Upper endoscopy July 02, 2020  Antimicrobials:  None  DVT prophylaxis: Lovenox   Objective: Vitals:   07/03/20 2022 07/04/20 0431 07/04/20 0800 07/04/20 1200  BP:  100/66 97/69 (!) 88/59  Pulse: 75 74 77 74  Resp: 16 16 18 18   Temp: 99.1 F (37.3 C) 97.8 F (36.6 C) 97.8 F (36.6 C) 97.9 F (36.6 C)  TempSrc:   Oral Oral  SpO2: 100% 100% 100% 100%  Weight:      Height:        Intake/Output Summary (Last 24 hours) at 07/04/2020 1207 Last data filed at 07/04/2020 0945 Gross per 24 hour  Intake 2707.79 ml  Output --  Net 2707.79 ml   Filed Weights   07/01/20 1448 07/02/20 0036 07/02/20 1241  Weight: 48.5 kg 48.4 kg 48.5 kg   Body mass index is 15.57 kg/m.  Exam:  . General: 55 y.o. year-old male well developed well nourished in no acute distress.  Alert and oriented x3. . Cardiovascular: Regular rate and rhythm with no rubs or gallops.  No  thyromegaly or JVD noted.   Marland Kitchen Respiratory: Clear to auscultation with no wheezes or rales. Good inspiratory effort. . Abdomen: Soft nontender nondistended with normal bowel sounds x4 quadrants. . Musculoskeletal: No lower extremity edema. 2/4 pulses in all 4 extremities. . Skin: No ulcerative lesions noted or rashes, . Psychiatry: Mood is appropriate for condition and setting    Data Reviewed: CBC: Recent Labs  Lab 07/01/20 1450 07/02/20 0526 07/03/20 0502 07/04/20 0458  WBC 10.1 8.4 5.9 8.9  HGB 13.5 10.6* 8.9* 9.0*  HCT RESULTS UNAVAILABLE DUE TO INTERFERING SUBSTANCE Unable to determine due to a cold agglutinin 24.2* 24.9*  MCV RESULTS UNAVAILABLE DUE TO INTERFERING SUBSTANCE Unable to determine due to a cold agglutinin 94.9 96.9  PLT 191 165 139* 144*   Basic Metabolic Panel: Recent Labs  Lab 07/01/20 1450 07/02/20 0526 07/02/20 0941 07/02/20 1118 07/02/20 1711 07/03/20 0502 07/04/20 0458  NA 132* 138  --   --   --  143 145  K 3.0* 2.6* 2.8* 3.0* 3.0* 3.5 3.8  CL 107 116*  --   --   --  125* 128*  CO2 17* 17*  --   --   --  17* 15*  GLUCOSE 93 84  --   --   --  97 89  BUN 19 17  --   --   --  11 11  CREATININE 3.47* 3.07*  --   --   --  2.43* 2.12*  CALCIUM 8.5* 7.8*  --   --   --  7.8* 7.8*  MG  --   --   --  1.9  --  2.0 1.6*  PHOS  --   --   --   --   --   --  1.4*   GFR: Estimated Creatinine Clearance: 27.6 mL/min (A) (by C-G formula based on SCr of 2.12 mg/dL (H)). Liver Function Tests: No results for input(s): AST, ALT, ALKPHOS, BILITOT, PROT, ALBUMIN in the last 168 hours. No results for input(s): LIPASE, AMYLASE in the last 168 hours. No results for input(s): AMMONIA in the last 168 hours. Coagulation Profile: No results for input(s): INR, PROTIME in the last 168 hours. Cardiac Enzymes: No results for input(s): CKTOTAL, CKMB, CKMBINDEX, TROPONINI in the last 168 hours. BNP (last 3 results) No results for input(s): PROBNP in the last 8760  hours. HbA1C: No results for input(s): HGBA1C in the last 72 hours. CBG: No results for input(s): GLUCAP in the last 168 hours. Lipid Profile: No results for input(s): CHOL, HDL, LDLCALC, TRIG, CHOLHDL, LDLDIRECT in the last 72 hours. Thyroid Function Tests: No results for input(s): TSH, T4TOTAL, FREET4, T3FREE, THYROIDAB in the last 72 hours. Anemia Panel: Recent Labs    07/02/20 1711  VITAMINB12 347  FOLATE 8.1  FERRITIN 342*  TIBC 182*  IRON 165   Urine analysis:    Component Value Date/Time   COLORURINE YELLOW (A) 11/05/2014 1939   APPEARANCEUR CLEAR (A) 11/05/2014 1939   LABSPEC 1.012 11/05/2014 1939   PHURINE 6.0 11/05/2014 1939   GLUCOSEU NEGATIVE 11/05/2014 1939   HGBUR NEGATIVE  11/05/2014 1939   BILIRUBINUR NEGATIVE 11/05/2014 1939   KETONESUR NEGATIVE 11/05/2014 1939   PROTEINUR NEGATIVE 11/05/2014 1939   NITRITE NEGATIVE 11/05/2014 1939   LEUKOCYTESUR NEGATIVE 11/05/2014 1939   Sepsis Labs: @LABRCNTIP (procalcitonin:4,lacticidven:4)  ) Recent Results (from the past 240 hour(s))  SARS CORONAVIRUS 2 (TAT 6-24 HRS) Nasopharyngeal Nasopharyngeal Swab     Status: None   Collection Time: 07/01/20  6:54 PM   Specimen: Nasopharyngeal Swab  Result Value Ref Range Status   SARS Coronavirus 2 NEGATIVE NEGATIVE Final    Comment: (NOTE) SARS-CoV-2 target nucleic acids are NOT DETECTED.  The SARS-CoV-2 RNA is generally detectable in upper and lower respiratory specimens during the acute phase of infection. Negative results do not preclude SARS-CoV-2 infection, do not rule out co-infections with other pathogens, and should not be used as the sole basis for treatment or other patient management decisions. Negative results must be combined with clinical observations, patient history, and epidemiological information. The expected result is Negative.  Fact Sheet for Patients: 08/31/20  Fact Sheet for Healthcare  Providers: HairSlick.no  This test is not yet approved or cleared by the [place] FDA and  has been authorized for detection and/or diagnosis of SARS-CoV-2 by FDA under an Emergency Use Authorization (EUA). This EUA will remain  in effect (meaning this test can be used) for the duration of the COVID-19 declaration under Se ction 564(b)(1) of the Act, 21 U.S.C. section 360bbb-3(b)(1), unless the authorization is terminated or revoked sooner.  Performed at [facility], [address] [place], [place]       Studies: No results found.  Scheduled Meds: . benztropine  0.5 mg Oral Daily  . carbamazepine  200 mg Oral QHS  . emtricitabine-tenofovir AF  1 tablet Oral Daily  . enoxaparin (LOVENOX) injection  30 mg Subcutaneous Q24H  . feeding supplement  237 mL Oral TID BM  . hydrOXYzine  50 mg Oral QHS  . levothyroxine  100 mcg Oral Q0600  . mirtazapine  15 mg Oral QHS  . multivitamin with minerals  1 tablet Oral Daily  . nicotine  21 mg Transdermal Daily  . pantoprazole (PROTONIX) IV  40 mg Intravenous Q12H  . phosphorus  250 mg Oral TID  . propranolol  10 mg Oral TID  . QUEtiapine  200 mg Oral QHS  . sucralfate  1 g Oral TID WC & HS  . tamsulosin  0.4 mg Oral Daily    Continuous Infusions: . 0.9 % NaCl with KCl 40 mEq / L 125 mL/hr at 07/04/20 09/03/20     LOS: 3 days     [id number], MD [facility]  To reach me or the doctor on call, go to: [email or web address] Password [facility]  07/04/2020, 12:07 PM

## 2020-07-04 NOTE — Anesthesia Postprocedure Evaluation (Signed)
Anesthesia Post Note  Patient: Jaime Vargas  Procedure(s) Performed: ESOPHAGOGASTRODUODENOSCOPY (EGD) WITH PROPOFOL (N/A )  Patient location during evaluation: PACU Anesthesia Type: General Level of consciousness: awake and alert Pain management: pain level controlled Vital Signs Assessment: post-procedure vital signs reviewed and stable Respiratory status: spontaneous breathing, nonlabored ventilation, respiratory function stable and patient connected to nasal cannula oxygen Cardiovascular status: blood pressure returned to baseline and stable Postop Assessment: no apparent nausea or vomiting Anesthetic complications: no   No complications documented.   Last Vitals:  Vitals:   07/04/20 0431 07/04/20 0800  BP: 100/66 97/69  Pulse: 74 77  Resp: 16 18  Temp: 36.6 C 36.6 C  SpO2: 100% 100%    Last Pain:  Vitals:   07/04/20 0800  TempSrc: Oral  PainSc: 0-No pain                 Yevette Edwards

## 2020-07-05 ENCOUNTER — Telehealth: Payer: Self-pay

## 2020-07-05 ENCOUNTER — Encounter: Payer: Self-pay | Admitting: Gastroenterology

## 2020-07-05 LAB — URINALYSIS, COMPLETE (UACMP) WITH MICROSCOPIC
Bilirubin Urine: NEGATIVE
Glucose, UA: >=500 mg/dL — AB
Hgb urine dipstick: NEGATIVE
Ketones, ur: NEGATIVE mg/dL
Leukocytes,Ua: NEGATIVE
Nitrite: NEGATIVE
Protein, ur: 30 mg/dL — AB
Specific Gravity, Urine: 1.01 (ref 1.005–1.030)
Squamous Epithelial / HPF: NONE SEEN (ref 0–5)
pH: 7 (ref 5.0–8.0)

## 2020-07-05 LAB — BASIC METABOLIC PANEL
Anion gap: 3 — ABNORMAL LOW (ref 5–15)
BUN: 14 mg/dL (ref 6–20)
CO2: 15 mmol/L — ABNORMAL LOW (ref 22–32)
Calcium: 7.8 mg/dL — ABNORMAL LOW (ref 8.9–10.3)
Chloride: 125 mmol/L — ABNORMAL HIGH (ref 98–111)
Creatinine, Ser: 1.98 mg/dL — ABNORMAL HIGH (ref 0.61–1.24)
GFR, Estimated: 40 mL/min — ABNORMAL LOW (ref >60)
Glucose, Bld: 95 mg/dL (ref 70–99)
Potassium: 4 mmol/L (ref 3.5–5.1)
Sodium: 143 mmol/L (ref 135–145)

## 2020-07-05 LAB — MAGNESIUM: Magnesium: 1.6 mg/dL — ABNORMAL LOW (ref 1.7–2.4)

## 2020-07-05 LAB — PHOSPHORUS: Phosphorus: 1.9 mg/dL — ABNORMAL LOW (ref 2.5–4.6)

## 2020-07-05 MED ORDER — LACTATED RINGERS IV SOLN: INTRAVENOUS | Status: DC

## 2020-07-05 MED ORDER — MAGNESIUM SULFATE 2 GM/50ML IV SOLN
2.0000 g | Freq: Once | INTRAVENOUS | Status: AC
  Administered 2020-07-05: 2 g via INTRAVENOUS
  Filled 2020-07-05: qty 50

## 2020-07-05 MED ORDER — PANTOPRAZOLE SODIUM 40 MG PO TBEC
40.0000 mg | DELAYED_RELEASE_TABLET | Freq: Two times a day (BID) | ORAL | Status: DC
  Administered 2020-07-05 – 2020-07-07 (×4): 40 mg via ORAL
  Filled 2020-07-05 (×4): qty 1

## 2020-07-05 MED ORDER — K PHOS MONO-SOD PHOS DI & MONO 155-852-130 MG PO TABS
250.0000 mg | ORAL_TABLET | Freq: Three times a day (TID) | ORAL | Status: AC
  Administered 2020-07-05 (×3): 250 mg via ORAL
  Filled 2020-07-05 (×3): qty 1

## 2020-07-05 NOTE — Consult Note (Signed)
PHARMACY CONSULT NOTE - FOLLOW UP  Pharmacy Consult for Electrolyte Monitoring and Replacement   Recent Labs: Potassium (mmol/L)  Date Value  07/05/2020 4.0   Magnesium (mg/dL)  Date Value  99/24/2683 1.6 (L)   Calcium (mg/dL)  Date Value  41/96/2229 7.8 (L)   Albumin (g/dL)  Date Value  79/89/2119 4.5   Phosphorus (mg/dL)  Date Value  41/74/0814 1.9 (L)   Sodium (mmol/L)  Date Value  07/05/2020 143     Assessment: 55 y.o. male with medical history significant for traumatic brain injury, schizoaffective disorder, remote history of tracheostomy status post decannulation who presents to the emergency room from the rest home where he resides for evaluation of severe dysphagia for solids and significant weight loss over 1 to 2 weeks.    Mg 1.6>1.6,  K 3.8>4.0, Phos 1.4>1.9;  Scr 2.43>2.12>1.98  Goal of Therapy:  Electrolytes WNL  Plan:  Continue NS with KCl @ 125 ml/hr. Will repeat with MgSO4 2g IV bolus Will repeat with KPhos tab 250mg  x 3  Mg, Phos, and K labs in am  ,PharmD Clinical Pharmacist 07/05/2020 7:25 AM

## 2020-07-05 NOTE — Progress Notes (Signed)
[facility]  PROGRESS NOTE  Jaime Vargas UYQ:[id number] DOB: [birth year] DOA: 07/01/2020 PCP: [facility]   Brief HPI:   55 year old male with a history of traumatic brain injury, schizoaffective disorder, recent history of tracheostomy s/p decannulation presented to ED from rest home for evaluation of dysphagia for solids and significant weight loss.  Patient was having trouble swallowing solids but able to drink liquids.  He had 20 pound weight loss over past 1 month.   In the ED CT scan of chest abdomen pelvis showed no specific cause for patient's postprandial pain and emesis.  Also had avascular necrosis of both femoral heads and mild contusion irregularity of left femoral head.  Lab work showed creatinine of 3.47, baseline creatinine 1.5   Subjective   Patient seen and examined, denies any complaints   Assessment/Plan:     Acute kidney injury -Presented with creatinine of 3.47; baseline creatinine 1.5 -Likely from poor p.o. intake -Improved with IV fluids; today creatinine is 1.98 -Has developed hyperchloremic metabolic acidosis, change IV fluids to LR at 100 mL/h -Follow BMP in am, if still acidotic, consider sodium bicarb tablets   Hypophosphatemia -Received K-Phos Neutral to 50 mg 3 times daily for 1 day only -Follow serum phosphorus level in a.m.   Dysphagia -Patient underwent EGD which showed erosive gastropathy with stigmata of recent bleeding; biopsied.     nonbleeding duodenal ulcer -Recommended to start Prilosec 40 mg p.o. twice daily for 3 months; start Prilosec at discharge.  Continue     protonix in the hospital -Repeat EGD in 3 months to check healing -Follow-up GI in 2 months   Schizoaffective disorder -Stable -Continue Seroquel, hydroxyzine, trazodone, Remeron   Hypothyroidism -Continue Synthroid   History of HIV -Continue Truvada     Scheduled medications:   . benztropine  0.5 mg Oral Daily  . carbamazepine  200 mg  Oral QHS  . emtricitabine-tenofovir AF  1 tablet Oral Daily  . enoxaparin (LOVENOX) injection  30 mg Subcutaneous Q24H  . feeding supplement  237 mL Oral TID BM  . hydrOXYzine  50 mg Oral QHS  . levothyroxine  100 mcg Oral Q0600  . mirtazapine  15 mg Oral QHS  . multivitamin with minerals  1 tablet Oral Daily  . nicotine  21 mg Transdermal Daily  . pantoprazole (PROTONIX) IV  40 mg Intravenous Q12H  . phosphorus  250 mg Oral TID  . propranolol  10 mg Oral TID  . QUEtiapine  200 mg Oral QHS  . sucralfate  1 g Oral TID WC & HS  . tamsulosin  0.4 mg Oral Daily         Data Reviewed:   CBG:  No results for input(s): GLUCAP in the last 168 hours.  SpO2: 100 %    Vitals:   07/05/20 0742 07/05/20 0815 07/05/20 1120 07/05/20 1456  BP: 109/75 112/62 112/70 115/70  Pulse: 73 82 80 79  Resp: 18 19 18 15   Temp: 98.4 F (36.9 C) 98.1 F (36.7 C) 97.9 F (36.6 C) 98.5 F (36.9 C)  TempSrc: Oral Oral Oral Oral  SpO2: 100% 100%  100%  Weight:      Height:         Intake/Output Summary (Last 24 hours) at 07/05/2020 1907 Last data filed at 07/05/2020 1500 Gross per 24 hour  Intake 1938.6 ml  Output 1275 ml  Net 663.6 ml    06/05 0701 - 06/06 1900 In: 5184.8 [P.O.:960; I.V.:3706] Out: 2325 [Urine:2325]  Filed Weights   07/01/20 1448 07/02/20 0036 07/02/20 1241  Weight: 48.5 kg 48.4 kg 48.5 kg    CBC:  Recent Labs  Lab 07/01/20 1450 07/02/20 0526 07/03/20 0502 07/04/20 0458  WBC 10.1 8.4 5.9 8.9  HGB 13.5 10.6* 8.9* 9.0*  HCT RESULTS UNAVAILABLE DUE TO INTERFERING SUBSTANCE Unable to determine due to a cold agglutinin 24.2* 24.9*  PLT 191 165 139* 144*  MCV RESULTS UNAVAILABLE DUE TO INTERFERING SUBSTANCE Unable to determine due to a cold agglutinin 94.9 96.9  MCH RESULTS UNAVAILABLE DUE TO INTERFERING SUBSTANCE 34.8* 34.9* 35.0*  MCHC RESULTS UNAVAILABLE DUE TO INTERFERING SUBSTANCE Unable to determine due to a cold agglutinin 36.8* 36.1*  RDW RESULTS  UNAVAILABLE DUE TO INTERFERING SUBSTANCE Unable to determine due to a cold agglutinin 13.8 14.6    Complete metabolic panel:  Recent Labs  Lab 07/01/20 1450 07/02/20 0526 07/02/20 0941 07/02/20 1118 07/02/20 1711 07/03/20 0502 07/04/20 0458 07/05/20 0532  NA 132* 138  --   --   --  143 145 143  K 3.0* 2.6*   < > 3.0* 3.0* 3.5 3.8 4.0  CL 107 116*  --   --   --  125* 128* 125*  CO2 17* 17*  --   --   --  17* 15* 15*  GLUCOSE 93 84  --   --   --  97 89 95  BUN 19 17  --   --   --  11 11 14   CREATININE 3.47* 3.07*  --   --   --  2.43* 2.12* 1.98*  CALCIUM 8.5* 7.8*  --   --   --  7.8* 7.8* 7.8*  MG  --   --   --  1.9  --  2.0 1.6* 1.6*   < > = values in this interval not displayed.    No results for input(s): LIPASE, AMYLASE in the last 168 hours.  Recent Labs  Lab 07/01/20 1854  SARSCOV2NAA NEGATIVE    ------------------------------------------------------------------------------------------------------------------ No results for input(s): CHOL, HDL, LDLCALC, TRIG, CHOLHDL, LDLDIRECT in the last 72 hours.  Lab Results  Component Value Date   HGBA1C 5.4 10/21/2014   ------------------------------------------------------------------------------------------------------------------ No results for input(s): TSH, T4TOTAL, T3FREE, THYROIDAB in the last 72 hours.  Invalid input(s): FREET3 ------------------------------------------------------------------------------------------------------------------ No results for input(s): VITAMINB12, FOLATE, FERRITIN, TIBC, IRON, RETICCTPCT in the last 72 hours.  Coagulation profile No results for input(s): INR, PROTIME in the last 168 hours. No results for input(s): DDIMER in the last 72 hours.  Cardiac Enzymes No results for input(s): CKTOTAL, CKMB, CKMBINDEX, TROPONINI in the last 168 hours.  ------------------------------------------------------------------------------------------------------------------ No results found for:  BNP   Antibiotics: Anti-infectives (From admission, onward)   Start     Dose/Rate Route Frequency Ordered Stop   07/02/20 1000  emtricitabine-tenofovir AF (DESCOVY) 200-25 MG per tablet 1 tablet        1 tablet Oral Daily 07/02/20 0743         Radiology Reports  No results found.    DVT prophylaxis: Lovenox  Code Status: Full code  Family Communication: No family at bedside   Consultants:    Procedures:      Objective    Physical Examination:   General-appears in no acute distress Heart-S1-S2, regular, no murmur auscultated Lungs-clear to auscultation bilaterally, no wheezing or crackles auscultated Abdomen-soft, nontender, no organomegaly Extremities-no edema in the lower extremities Neuro-alert, oriented x3, no focal deficit noted  Status is: Inpatient  Dispo: The patient is  from: Rest home              Anticipated d/c is to: Rest home              Anticipated d/c date is: 07/07/2020              Patient currently not stable for discharge  Barrier to discharge-acute kidney injury  COVID-19 Labs  No results for input(s): DDIMER, FERRITIN, LDH, CRP in the last 72 hours.  Lab Results  Component Value Date   SARSCOV2NAA NEGATIVE 07/01/2020    Microbiology  Recent Results (from the past 240 hour(s))  SARS CORONAVIRUS 2 (TAT 6-24 HRS) Nasopharyngeal Nasopharyngeal Swab     Status: None   Collection Time: 07/01/20  6:54 PM   Specimen: Nasopharyngeal Swab  Result Value Ref Range Status   SARS Coronavirus 2 NEGATIVE NEGATIVE Final    Comment: (NOTE) SARS-CoV-2 target nucleic acids are NOT DETECTED.  The SARS-CoV-2 RNA is generally detectable in upper and lower respiratory specimens during the acute phase of infection. Negative results do not preclude SARS-CoV-2 infection, do not rule out co-infections with other pathogens, and should not be used as the sole basis for treatment or other patient management decisions. Negative results must be  combined with clinical observations, patient history, and epidemiological information. The expected result is Negative.  Fact Sheet for Patients: HairSlick.no  Fact Sheet for Healthcare Providers: quierodirigir.com  This test is not yet approved or cleared by the [place] FDA and  has been authorized for detection and/or diagnosis of SARS-CoV-2 by FDA under an Emergency Use Authorization (EUA). This EUA will remain  in effect (meaning this test can be used) for the duration of the COVID-19 declaration under Se ction 564(b)(1) of the Act, 21 U.S.C. section 360bbb-3(b)(1), unless the authorization is terminated or revoked sooner.  Performed at [facility], [address] [place], [place] [address]              Meredeth Ide   [facility] If 7PM-7AM, please contact night-coverage at [email or web address], Office  [phone]   07/05/2020, 7:07 PM  LOS: 4 days

## 2020-07-05 NOTE — Progress Notes (Signed)
Cleven Jansma (brother) updated on room change.

## 2020-07-05 NOTE — Care Management Important Message (Signed)
Important Message  Patient Details  Name: Jaime Vargas MRN: [id number] Date of Birth: [birth year]   Medicare Important Message Given:  Yes     [name] 07/05/2020, 11:37 AM

## 2020-07-05 NOTE — Telephone Encounter (Signed)
-----   Message from Toney Reil, MD sent at 07/02/2020  1:34 PM EDT ----- Regarding: Hospital follow-up Patient lives in group home.  Please make a follow-up appointment to see me in 6 to 8 weeks  RV

## 2020-07-05 NOTE — Progress Notes (Signed)
     Referral received for Jaime Vargas :goals of care discussion. Chart reviewed and updates received from Dr. Sharl Ma. Patient is improving. No complains to pain or discomfort. Tolerating diet. No needs for palliative currently. Will sign-off.  Please do not hesitate to re-consult if needs arise.     Willette Alma, AGPCNP-BC Palliative Medicine Team  Pager: [phone] Amion: N. [place]   NO CHARGE

## 2020-07-05 NOTE — Evaluation (Signed)
Physical Therapy Evaluation Patient Details Name: Jaime Vargas MRN: [id number] DOB: 28-Feb-1965 Today's Date: 07/05/2020   History of Present Illness  Pt is a 55 yo male that presented to ED for dysphagia, weight loss. Workup showed acute kidney injury, hydration. Underwent EGD. PMH of current smoker, bipolar disorder, schizophrenia, renal disease, hypothyroidism, heart murmur, TBI, tracheostomy.    Clinical Impression  Patient A&Ox4, no pain signs/symptoms of pain, did reference chronic bilateral knee pain. Pt reported at baseline he is independent for mobility, no AD. Lives in a home that assists with meals/groceries, pt does not drive. Denied falls in the last 6 months.  The patient demonstrated bed mobility modI. UE and LE symmetrical and WFLs, and exhibited good sitting balance. Sit <> stand several times during session, supervision. He ambulated ~152ft with no AD, CGA. Pt with decreased gait velocity, decreased step height, length, and heel strike bilaterally. One bout of instability noted, pt able to correct with CGA. Pt also able to stand and use commode in room and wash hands with supervision. Pt returned to room, all needs in reach.  Overall the patient demonstrated deficits (see "PT Problem List") that impede the patient's functional abilities, safety, and mobility and would benefit from skilled PT intervention. Recommendation is HHPT with intermittent supervision.      Follow Up Recommendations Home health PT;Supervision - Intermittent    Equipment Recommendations  Cane    Recommendations for Other Services       Precautions / Restrictions Precautions Precautions: Fall Restrictions Weight Bearing Restrictions: No      Mobility  Bed Mobility Overal bed mobility: Modified Independent                  Transfers Overall transfer level: Needs assistance Equipment used: None Transfers: Sit to/from Stand Sit to Stand: Supervision            Ambulation/Gait    Gait Distance (Feet): 130 Feet Assistive device: None;1 person hand held assist   Gait velocity: decreased   General Gait Details: pt with decreased step height/length/heel strike bilaterally. Shuffled gait noted.  Stairs            Wheelchair Mobility    Modified Rankin (Stroke Patients Only)       Balance Overall balance assessment: Needs assistance Sitting-balance support: Feet supported Sitting balance-Leahy Scale: Good     Standing balance support: During functional activity Standing balance-Leahy Scale: Good                               Pertinent Vitals/Pain Pain Assessment: Faces Faces Pain Scale: No hurt    Home Living Family/patient expects to be discharged to:: Assisted living               Home Equipment: None      Prior Function Level of Independence: Needs assistance   Gait / Transfers Assistance Needed: independent for gait/transfers, did report that he feels like he needs an AD  ADL's / Homemaking Assistance Needed: independent in ADLs. Does not drive;home provides meals  Comments: denies any falls in the last 6 months     Hand Dominance   Dominant Hand: Right    Extremity/Trunk Assessment   Upper Extremity Assessment Upper Extremity Assessment: Overall WFL for tasks assessed (grossly 4+/5)    Lower Extremity Assessment Lower Extremity Assessment:  (grossly 4+/5)    Cervical / Trunk Assessment Cervical / Trunk Assessment: Normal  Communication  Communication: No difficulties  Cognition Arousal/Alertness: Awake/alert Behavior During Therapy: WFL for tasks assessed/performed Overall Cognitive Status: Within Functional Limits for tasks assessed                                        General Comments      Exercises Other Exercises Other Exercises: 5 times sit to stand with UE support: 18 seconds. 5 times sit to stand without UE support: 20seconds and significant reliance on bed rails with  legs due to posterior lean Other Exercises: Pt able to stand and void and wash his hands in the bathroom with supervision   Assessment/Plan    PT Assessment Patient needs continued PT services  PT Problem List Decreased strength;Decreased knowledge of use of DME;Decreased activity tolerance;Decreased balance;Decreased mobility       PT Treatment Interventions DME instruction;Balance training;Gait training;Neuromuscular re-education;Stair training;Functional mobility training;Patient/family education;Therapeutic activities;Therapeutic exercise    PT Goals (Current goals can be found in the Care Plan section)  Acute Rehab PT Goals Patient Stated Goal: to feel better PT Goal Formulation: With patient Time For Goal Achievement: 07/19/20 Potential to Achieve Goals: Good Additional Goals Additional Goal #1: the patient will perform 5 times sit to stand without UE support in 12 seconds or less indicating decreased risk of falls and improved functional mobility.    Frequency Min 2X/week   Barriers to discharge        Co-evaluation               AM-PAC PT "6 Clicks" Mobility  Outcome Measure Help needed turning from your back to your side while in a flat bed without using bedrails?: None Help needed moving from lying on your back to sitting on the side of a flat bed without using bedrails?: None Help needed moving to and from a bed to a chair (including a wheelchair)?: A Little Help needed standing up from a chair using your arms (e.g., wheelchair or bedside chair)?: A Little Help needed to walk in hospital room?: A Little Help needed climbing 3-5 steps with a railing? : A Little 6 Click Score: 20    End of Session Equipment Utilized During Treatment: Gait belt Activity Tolerance: Patient tolerated treatment well Patient left: in bed;with call bell/phone within reach;with bed alarm set Nurse Communication: Mobility status PT Visit Diagnosis: Other abnormalities of gait and  mobility (R26.89);Difficulty in walking, not elsewhere classified (R26.2);Muscle weakness (generalized) (M62.81)    Time: 4128-7867 PT Time Calculation (min) (ACUTE ONLY): 17 min   Charges:   PT Evaluation $PT Eval Low Complexity: 1 Low          Olga Coaster PT, DPT 2:40 PM,07/05/20

## 2020-07-05 NOTE — Progress Notes (Signed)
Pt. Transferred to me from 2A. Patient is alert and oriented, no complaints of pain or discomfort at this time.

## 2020-07-05 NOTE — Evaluation (Signed)
Occupational Therapy Evaluation Patient Details Name: Jaime Vargas MRN: [id number] DOB: 29-Sep-1965 Today's Date: 07/05/2020    History of Present Illness Pt is a 55 yo male that presented to ED for dysphagia, weight loss. Workup showed acute kidney injury, hydration. Underwent EGD. PMH of current smoker, bipolar disorder, schizophrenia, renal disease, hypothyroidism, heart murmur, TBI, tracheostomy.   Clinical Impression   Jaime Vargas was seen for OT evaluation this date. Prior to hospital admission, pt was Independent for mobility and ADLs, assist from group home/ALF for meals/medication mgmt. Pt presents to acute OT demonstrating impaired ADL performance and functional mobility 2/2 decreased safety awareness and functional strength deficits. Pt currently requires SUPERVISION toielting on standard commode, assist for safety awareness. Anticipate Standby standing to shower. SBA + IV pole for ADL t/f. Pt would benefit from skilled OT to address noted impairments and functional limitations (see below for any additional details) in order to maximize safety and independence while minimizing falls risk and caregiver burden. Upon hospital discharge, recommend HHOT to maximize pt safety and return to functional independence during meaningful occupations of daily life.     Follow Up Recommendations  Home health OT;Supervision - Intermittent    Equipment Recommendations  None recommended by OT    Recommendations for Other Services       Precautions / Restrictions Precautions Precautions: Fall Restrictions Weight Bearing Restrictions: No      Mobility Bed Mobility Overal bed mobility: Modified Independent                  Transfers Overall transfer level: Needs assistance Equipment used: None Transfers: Sit to/from Stand Sit to Stand: Supervision              Balance Overall balance assessment: Needs assistance Sitting-balance support: Feet supported Sitting balance-Leahy  Scale: Good     Standing balance support: No upper extremity supported;During functional activity Standing balance-Leahy Scale: Good                             ADL either performed or assessed with clinical judgement   ADL Overall ADL's : Needs assistance/impaired                                       General ADL Comments: SUPERVISION toielting on standard commode, assist for safety awareness. Anticipate Standby standing to shower. SBA + IV pole for ADL t/f                  Pertinent Vitals/Pain Pain Assessment: No/denies pain Faces Pain Scale: No hurt     Hand Dominance Right   Extremity/Trunk Assessment Upper Extremity Assessment Upper Extremity Assessment: Overall WFL for tasks assessed   Lower Extremity Assessment Lower Extremity Assessment: Overall WFL for tasks assessed   Cervical / Trunk Assessment Cervical / Trunk Assessment: Normal   Communication Communication Communication: No difficulties   Cognition Arousal/Alertness: Awake/alert Behavior During Therapy: WFL for tasks assessed/performed Overall Cognitive Status: Within Functional Limits for tasks assessed                                 General Comments: decreased safety awareness   General Comments       Exercises Exercises: Other exercises Other Exercises Other Exercises: Pt educated re: OT role, DME recs, d/c recs, falls  prevention, ECS Other Exercises: LBD, toileting, hand wahsing, sup<>sit, sit<>stand, sitting/standing balance/tolerance   Shoulder Instructions      Home Living Family/patient expects to be discharged to:: Assisted living                             Home Equipment: None          Prior Functioning/Environment Level of Independence: Needs assistance  Gait / Transfers Assistance Needed: independent for gait/transfers, did report that he feels like he needs an AD ADL's / Homemaking Assistance Needed: independent in  ADLs. Does not drive;home provides meals and medication mgmt   Comments: denies any falls in the last 6 months        OT Problem List: Decreased strength;Decreased activity tolerance      OT Treatment/Interventions: Therapeutic exercise;Self-care/ADL training;Energy conservation;DME and/or AE instruction;Therapeutic activities;Patient/family education;Balance training    OT Goals(Current goals can be found in the care plan section) Acute Rehab OT Goals Patient Stated Goal: to feel better OT Goal Formulation: With patient Time For Goal Achievement: 07/19/20 Potential to Achieve Goals: Good ADL Goals Pt Will Perform Lower Body Dressing: Independently;sitting/lateral leans Pt Will Perform Tub/Shower Transfer: Tub transfer;Independently;ambulating Additional ADL Goal #1: Pt will Independently verbalize plan to implement x3 falls prevention strategies  OT Frequency: Min 1X/week    AM-PAC OT "6 Clicks" Daily Activity     Outcome Measure Help from another person eating meals?: None Help from another person taking care of personal grooming?: A Little Help from another person toileting, which includes using toliet, bedpan, or urinal?: A Little Help from another person bathing (including washing, rinsing, drying)?: A Little Help from another person to put on and taking off regular upper body clothing?: A Little Help from another person to put on and taking off regular lower body clothing?: A Little 6 Click Score: 19   End of Session Nurse Communication: Mobility status  Activity Tolerance: Patient tolerated treatment well Patient left: in bed;with call bell/phone within reach;with bed alarm set  OT Visit Diagnosis: Other abnormalities of gait and mobility (R26.89);Muscle weakness (generalized) (M62.81)                Time: 0258-5277 OT Time Calculation (min): 20 min Charges:  OT General Charges $OT Visit: 1 Visit OT Evaluation $OT Eval Low Complexity: 1 Low OT Treatments $Self  Care/Home Management : 8-22 mins  Kathie Dike, M.S. OTR/L  07/05/20, 4:47 PM  ascom [phone]

## 2020-07-05 NOTE — Telephone Encounter (Signed)
Tried to call the patient home number and when the lady answer the phone she said she does not know what I am talking about and disconnected the phone. Patient is still admitted in the hospital so going to scheduled appointment and appointment date will print on patient hospital discharge paper. Made appointment for 08/16/2020

## 2020-07-06 LAB — MAGNESIUM: Magnesium: 1.7 mg/dL (ref 1.7–2.4)

## 2020-07-06 LAB — IRON AND TIBC
Iron: 69 ug/dL (ref 45–182)
Saturation Ratios: 38 % (ref 17.9–39.5)
TIBC: 182 ug/dL — ABNORMAL LOW (ref 250–450)
UIBC: 113 ug/dL

## 2020-07-06 LAB — BASIC METABOLIC PANEL
Anion gap: 3 — ABNORMAL LOW (ref 5–15)
BUN: 20 mg/dL (ref 6–20)
CO2: 16 mmol/L — ABNORMAL LOW (ref 22–32)
Calcium: 8.1 mg/dL — ABNORMAL LOW (ref 8.9–10.3)
Chloride: 117 mmol/L — ABNORMAL HIGH (ref 98–111)
Creatinine, Ser: 1.77 mg/dL — ABNORMAL HIGH (ref 0.61–1.24)
GFR, Estimated: 45 mL/min — ABNORMAL LOW (ref >60)
Glucose, Bld: 89 mg/dL (ref 70–99)
Potassium: 4.2 mmol/L (ref 3.5–5.1)
Sodium: 136 mmol/L (ref 135–145)

## 2020-07-06 LAB — VITAMIN B12: Vitamin B-12: 251 pg/mL (ref 180–914)

## 2020-07-06 LAB — RETICULOCYTES
Immature Retic Fract: 12.5 % (ref 2.3–15.9)
RBC.: 2.64 MIL/uL — ABNORMAL LOW (ref 4.22–5.81)
Retic Count, Absolute: 25.6 10*3/uL (ref 19.0–186.0)
Retic Ct Pct: 1 % (ref 0.4–3.1)

## 2020-07-06 LAB — PHOSPHORUS: Phosphorus: 2.7 mg/dL (ref 2.5–4.6)

## 2020-07-06 LAB — FERRITIN: Ferritin: 219 ng/mL (ref 24–336)

## 2020-07-06 LAB — SURGICAL PATHOLOGY

## 2020-07-06 LAB — FOLATE: Folate: 10.1 ng/mL (ref >5.9)

## 2020-07-06 NOTE — Consult Note (Signed)
PHARMACY CONSULT NOTE - FOLLOW UP  Pharmacy Consult for Electrolyte Monitoring and Replacement   Recent Labs: Potassium (mmol/L)  Date Value  07/06/2020 4.2   Magnesium (mg/dL)  Date Value  40/98/1191 1.7   Calcium (mg/dL)  Date Value  47/82/9562 8.1 (L)   Albumin (g/dL)  Date Value  13/08/6576 4.5   Phosphorus (mg/dL)  Date Value  46/96/2952 2.7   Sodium (mmol/L)  Date Value  07/06/2020 136     Assessment: 56 y.o. male with medical history significant for traumatic brain injury, schizoaffective disorder, remote history of tracheostomy status post decannulation who presents to the emergency room from the rest home where he resides for evaluation of severe dysphagia for solids and significant weight loss over 1 to 2 weeks.    Mg 1.6>1.6>1.7 K 3.8>4.0>4.2 Phos 1.4>1.9>2.7 Scr 2.43>2.12>1.98>1.77  Goal of Therapy:  Electrolytes WNL  Plan:  Electrolytes WNL Mg, Phos, and K labs in am  Derrek Gu ,PharmD Clinical Pharmacist 07/06/2020 7:32 AM

## 2020-07-06 NOTE — Progress Notes (Addendum)
[facility]  PROGRESS NOTE  Kyvon Hu ZOX:[id number] DOB: 10-Apr-1965 DOA: 07/01/2020 PCP[facility]   Brief HPI:   55 year old male with a history of traumatic brain injury, schizoaffective disorder, recent history of tracheostomy s/p decannulation presented to ED from rest home for evaluation of dysphagia for solids and significant weight loss.  Patient was having trouble swallowing solids but able to drink liquids.  He had 20 pound weight loss over past 1 month.   In the ED CT scan of chest abdomen pelvis showed no specific cause for patient's postprandial pain and emesis.  Also had avascular necrosis of both femoral heads and mild contusion irregularity of left femoral head.  Lab work showed creatinine of 3.47, baseline creatinine 1.5   Subjective   Patient seen and examined, denies shortness of breath or chest pain.  No nausea or vomiting.   Assessment/Plan:     Acute kidney injury -Presented with creatinine of 3.47; baseline creatinine 1.5 -Likely from poor p.o. intake -Improved with IV fluids; today creatinine is 1.77 -he developed hyperchloremic metabolic acidosis, change IV fluids to LR at 100 mL/h -Slowly improving, bicarb is 16, chloride is down to 117 -Follow BMP in a.m.   Hypophosphatemia -Received K-Phos Neutral to 50 mg 3 times daily for 1 day only -Resolved. -Today phosphorus is 2.7   Dysphagia -Patient underwent EGD which showed erosive gastropathy with stigmata of recent bleeding; biopsied.     nonbleeding duodenal ulcer -Recommended to start Prilosec 40 mg p.o. twice daily for 3 months; start Prilosec at discharge.  Continue     protonix in the hospital -Repeat EGD in 3 months to check healing -Follow-up GI in 2 months   Anemia -Hemoglobin was 13.5 on 07/01/2020 -Today hemoglobin is 9.0 -Likely dilutional due to aggressive IV fluid replacement along with GI bleed -We will check anemia panel   Schizoaffective  disorder -Stable -Continue Seroquel, hydroxyzine, trazodone, remeron, tegretol   Hypothyroidism -Continue Synthroid   Questionable history of HIV -Patient's home medication list has Truvada -Patient is not aware of HIV history -Found out this was prescribed by FNP Duffy Rhody in [place] -Discussed with FNP, she says that patient was having intercourse with a HIV patient at rest home, who later died -She was concerned that he would contract HIV, so she started him on Truvada -HIV antibody test was negative on 07/02/2020 -Will discontinue Truvada     Scheduled medications:   . benztropine  0.5 mg Oral Daily  . carbamazepine  200 mg Oral QHS  . emtricitabine-tenofovir AF  1 tablet Oral Daily  . enoxaparin (LOVENOX) injection  30 mg Subcutaneous Q24H  . feeding supplement  237 mL Oral TID BM  . hydrOXYzine  50 mg Oral QHS  . levothyroxine  100 mcg Oral Q0600  . mirtazapine  15 mg Oral QHS  . multivitamin with minerals  1 tablet Oral Daily  . nicotine  21 mg Transdermal Daily  . pantoprazole  40 mg Oral BID  . propranolol  10 mg Oral TID  . QUEtiapine  200 mg Oral QHS  . sucralfate  1 g Oral TID WC & HS  . tamsulosin  0.4 mg Oral Daily         Data Reviewed:   CBG:  No results for input(s): GLUCAP in the last 168 hours.  SpO2: 100 %    Vitals:   07/05/20 2124 07/06/20 0030 07/06/20 0422 07/06/20 0826  BP: 110/63 125/77 107/75 104/69  Pulse: 69 80 85  78  Resp: 19 18 18 16   Temp: 98.6 F (37 C) 98.4 F (36.9 C) 98.4 F (36.9 C) 98.1 F (36.7 C)  TempSrc: Oral     SpO2: 100% 99% 100% 100%  Weight:      Height:         Intake/Output Summary (Last 24 hours) at 07/06/2020 1151 Last data filed at 07/06/2020 1000 Gross per 24 hour  Intake 1981.93 ml  Output --  Net 1981.93 ml    06/05 1901 - 06/07 0700 In: 2481.9 [P.O.:240; I.V.:2241.9] Out: 1275 [Urine:1275]  Filed Weights   07/01/20 1448 07/02/20 0036 07/02/20 1241  Weight: 48.5 kg 48.4 kg  48.5 kg    CBC:  Recent Labs  Lab 07/01/20 1450 07/02/20 0526 07/03/20 0502 07/04/20 0458  WBC 10.1 8.4 5.9 8.9  HGB 13.5 10.6* 8.9* 9.0*  HCT RESULTS UNAVAILABLE DUE TO INTERFERING SUBSTANCE Unable to determine due to a cold agglutinin 24.2* 24.9*  PLT 191 165 139* 144*  MCV RESULTS UNAVAILABLE DUE TO INTERFERING SUBSTANCE Unable to determine due to a cold agglutinin 94.9 96.9  MCH RESULTS UNAVAILABLE DUE TO INTERFERING SUBSTANCE 34.8* 34.9* 35.0*  MCHC RESULTS UNAVAILABLE DUE TO INTERFERING SUBSTANCE Unable to determine due to a cold agglutinin 36.8* 36.1*  RDW RESULTS UNAVAILABLE DUE TO INTERFERING SUBSTANCE Unable to determine due to a cold agglutinin 13.8 14.6    Complete metabolic panel:  Recent Labs  Lab 07/02/20 0526 07/02/20 0941 07/02/20 1118 07/02/20 1711 07/03/20 0502 07/04/20 0458 07/05/20 0532 07/06/20 0527  NA 138  --   --   --  143 145 143 136  K 2.6*   < > 3.0* 3.0* 3.5 3.8 4.0 4.2  CL 116*  --   --   --  125* 128* 125* 117*  CO2 17*  --   --   --  17* 15* 15* 16*  GLUCOSE 84  --   --   --  97 89 95 89  BUN 17  --   --   --  11 11 14 20   CREATININE 3.07*  --   --   --  2.43* 2.12* 1.98* 1.77*  CALCIUM 7.8*  --   --   --  7.8* 7.8* 7.8* 8.1*  MG  --   --  1.9  --  2.0 1.6* 1.6* 1.7   < > = values in this interval not displayed.    No results for input(s): LIPASE, AMYLASE in the last 168 hours.  Recent Labs  Lab 07/01/20 1854  SARSCOV2NAA NEGATIVE    ------------------------------------------------------------------------------------------------------------------ No results for input(s): CHOL, HDL, LDLCALC, TRIG, CHOLHDL, LDLDIRECT in the last 72 hours.  Lab Results  Component Value Date   HGBA1C 5.4 10/21/2014   ------------------------------------------------------------------------------------------------------------------ No results for input(s): TSH, T4TOTAL, T3FREE, THYROIDAB in the last 72 hours.  Invalid input(s):  FREET3 ------------------------------------------------------------------------------------------------------------------ No results for input(s): VITAMINB12, FOLATE, FERRITIN, TIBC, IRON, RETICCTPCT in the last 72 hours.  Coagulation profile No results for input(s): INR, PROTIME in the last 168 hours. No results for input(s): DDIMER in the last 72 hours.  Cardiac Enzymes No results for input(s): CKTOTAL, CKMB, CKMBINDEX, TROPONINI in the last 168 hours.  ------------------------------------------------------------------------------------------------------------------ No results found for: BNP   Antibiotics: Anti-infectives (From admission, onward)   Start     Dose/Rate Route Frequency Ordered Stop   07/02/20 1000  emtricitabine-tenofovir AF (DESCOVY) 200-25 MG per tablet 1 tablet        1 tablet Oral Daily 07/02/20 0743  Radiology Reports  No results found.    DVT prophylaxis: Lovenox  Code Status: Full code  Family Communication: No family at bedside   Consultants:    Procedures:      Objective    Physical Examination:   General-appears in no acute distress Heart-S1-S2, regular, no murmur auscultated Lungs-clear to auscultation bilaterally, no wheezing or crackles auscultated Abdomen-soft, nontender, no organomegaly Extremities-no edema in the lower extremities Neuro-alert, oriented x3, no focal deficit noted  Status is: Inpatient  Dispo: The patient is from: Rest home              Anticipated d/c is to: Rest home              Anticipated d/c date is: 07/07/2020              Patient currently not stable for discharge  Barrier to discharge-acute kidney injury  COVID-19 Labs  No results for input(s): DDIMER, FERRITIN, LDH, CRP in the last 72 hours.  Lab Results  Component Value Date   SARSCOV2NAA NEGATIVE 07/01/2020    Microbiology  Recent Results (from the past 240 hour(s))  SARS CORONAVIRUS 2 (TAT 6-24 HRS) Nasopharyngeal  Nasopharyngeal Swab     Status: None   Collection Time: 07/01/20  6:54 PM   Specimen: Nasopharyngeal Swab  Result Value Ref Range Status   SARS Coronavirus 2 NEGATIVE NEGATIVE Final    Comment: (NOTE) SARS-CoV-2 target nucleic acids are NOT DETECTED.  The SARS-CoV-2 RNA is generally detectable in upper and lower respiratory specimens during the acute phase of infection. Negative results do not preclude SARS-CoV-2 infection, do not rule out co-infections with other pathogens, and should not be used as the sole basis for treatment or other patient management decisions. Negative results must be combined with clinical observations, patient history, and epidemiological information. The expected result is Negative.  Fact Sheet for Patients: HairSlick.no  Fact Sheet for Healthcare Providers: quierodirigir.com  This test is not yet approved or cleared by the [place] FDA and  has been authorized for detection and/or diagnosis of SARS-CoV-2 by FDA under an Emergency Use Authorization (EUA). This EUA will remain  in effect (meaning this test can be used) for the duration of the COVID-19 declaration under Se ction 564(b)(1) of the Act, 21 U.S.C. section 360bbb-3(b)(1), unless the authorization is terminated or revoked sooner.  Performed at [facility], [address], [place], [place] [address]              Meredeth Ide   [facility] If 7PM-7AM, please contact night-coverage at [email or web address], Office  [phone]   07/06/2020, 11:51 AM  LOS: 5 days

## 2020-07-06 NOTE — TOC Progression Note (Signed)
Transition of Care [facility]) - Progression Note    Patient Details  Name: Jaime Vargas MRN: [id number] Date of Birth: 01/23/1966  [facility]) CM/SW Contact  Allayne Butcher, RN Phone Number: 07/06/2020, 4:17 PM  Clinical Narrative:    Patient expected to be medically stable for DC tomorrow.  PT is recommending home health services, patient agrees to having home health arranged and has no preference in agency.  Becky Sax with [facility] has accepted referral for PT, OT, and speech.  [facility] notified that patient expected to discharge tomorrow.  RNCM has left a message for them that they need to provide transportation tomorrow.      Expected Discharge Plan: Skilled Nursing Facility Barriers to Discharge: Continued Medical Work up  Expected Discharge Plan and Services Expected Discharge Plan: Skilled Nursing Facility     Post Acute Care Choice: Skilled Nursing Facility Living arrangements for the past 2 months: Assisted Living Facility [facility])                                       Social Determinants of Health (SDOH) Interventions    Readmission Risk Interventions No flowsheet data found.

## 2020-07-07 DIAGNOSIS — K2980 Duodenitis without bleeding: Secondary | ICD-10-CM

## 2020-07-07 DIAGNOSIS — F17200 Nicotine dependence, unspecified, uncomplicated: Secondary | ICD-10-CM

## 2020-07-07 DIAGNOSIS — K2950 Unspecified chronic gastritis without bleeding: Secondary | ICD-10-CM

## 2020-07-07 DIAGNOSIS — K209 Esophagitis, unspecified without bleeding: Secondary | ICD-10-CM

## 2020-07-07 LAB — BASIC METABOLIC PANEL
Anion gap: 5 (ref 5–15)
BUN: 22 mg/dL — ABNORMAL HIGH (ref 6–20)
CO2: 18 mmol/L — ABNORMAL LOW (ref 22–32)
Calcium: 8.4 mg/dL — ABNORMAL LOW (ref 8.9–10.3)
Chloride: 114 mmol/L — ABNORMAL HIGH (ref 98–111)
Creatinine, Ser: 1.84 mg/dL — ABNORMAL HIGH (ref 0.61–1.24)
GFR, Estimated: 43 mL/min — ABNORMAL LOW (ref >60)
Glucose, Bld: 88 mg/dL (ref 70–99)
Potassium: 3.9 mmol/L (ref 3.5–5.1)
Sodium: 137 mmol/L (ref 135–145)

## 2020-07-07 LAB — MAGNESIUM: Magnesium: 1.6 mg/dL — ABNORMAL LOW (ref 1.7–2.4)

## 2020-07-07 LAB — PHOSPHORUS: Phosphorus: 2.3 mg/dL — ABNORMAL LOW (ref 2.5–4.6)

## 2020-07-07 MED ORDER — ACETAMINOPHEN 500 MG PO TABS: 500.0000 mg | ORAL_TABLET | Freq: Three times a day (TID) | ORAL | 0 refills | Status: AC | PRN

## 2020-07-07 MED ORDER — PANTOPRAZOLE SODIUM 40 MG PO TBEC: 40.0000 mg | DELAYED_RELEASE_TABLET | Freq: Two times a day (BID) | ORAL | 0 refills | Status: DC

## 2020-07-07 MED ORDER — MAGNESIUM OXIDE -MG SUPPLEMENT 400 (240 MG) MG PO TABS
800.0000 mg | ORAL_TABLET | Freq: Once | ORAL | Status: AC
  Administered 2020-07-07: 12:00:00 800 mg via ORAL
  Filled 2020-07-07: qty 2

## 2020-07-07 MED ORDER — POTASSIUM PHOSPHATES 15 MMOLE/5ML IV SOLN
10.0000 mmol | Freq: Once | INTRAVENOUS | Status: DC
  Filled 2020-07-07: qty 3.33

## 2020-07-07 MED ORDER — MAGNESIUM SULFATE 2 GM/50ML IV SOLN
2.0000 g | Freq: Once | INTRAVENOUS | Status: DC
  Filled 2020-07-07: qty 50

## 2020-07-07 MED ORDER — OMEPRAZOLE MAGNESIUM 20 MG PO TBEC: 40.0000 mg | DELAYED_RELEASE_TABLET | Freq: Two times a day (BID) | ORAL | 0 refills | Status: AC

## 2020-07-07 MED ORDER — NICOTINE 21 MG/24HR TD PT24
21.0000 mg | MEDICATED_PATCH | Freq: Every day | TRANSDERMAL | 0 refills | Status: AC
Start: 2020-07-08 — End: ?

## 2020-07-07 MED ORDER — CARBAMAZEPINE 200 MG PO TABS: 200.0000 mg | ORAL_TABLET | Freq: Every day | ORAL | 1 refills | Status: AC

## 2020-07-07 MED ORDER — ADULT MULTIVITAMIN W/MINERALS CH: 1.0000 | ORAL_TABLET | Freq: Every day | ORAL | 1 refills | Status: AC

## 2020-07-07 MED ORDER — ENSURE ENLIVE PO LIQD: 237.0000 mL | Freq: Three times a day (TID) | ORAL | 0 refills | Status: AC

## 2020-07-07 MED ORDER — SUCRALFATE 1 G PO TABS: 1.0000 g | ORAL_TABLET | Freq: Four times a day (QID) | ORAL | 0 refills | Status: AC

## 2020-07-07 NOTE — [place] FL2 (Signed)
[place] MEDICAID FL2 LEVEL OF CARE SCREENING TOOL     IDENTIFICATION  Patient Name: Jaime Vargas Birthdate: 02/25/1965 Sex: male Admission Date (Current Location): 07/01/2020  [place] and [place] Number:  Chiropodist and Address:  [facility], [address], [place], [place] [address]      Provider Number: [id number]  Attending Physician Name and Address:  Almon Hercules, MD  Relative Name and Phone Number:  [name] (brother) [phone]    Current Level of Care: Hospital Recommended Level of Care: Other (Comment),Family Care Home (Family Care home) Prior Approval Number:    Date Approved/Denied:   PASRR Number: [id number]  Discharge Plan: Other (Comment) [facility])    Current Diagnoses: Patient Active Problem List   Diagnosis Date Noted  . Protein-calorie malnutrition, severe 07/02/2020  . AKI (acute kidney injury) (HCC) 07/01/2020  . Dysphagia 07/01/2020  . Hypothyroidism 07/01/2020  . Tobacco use disorder 10/20/2014  . Schizoaffective disorder, bipolar type (HCC) 10/19/2014  . Traumatic brain injury (HCC) 10/19/2014    Orientation RESPIRATION BLADDER Height & Weight     Self,Time,Situation,Place  Normal Continent Weight: 48.5 kg Height:  5' 9.5" (176.5 cm)  BEHAVIORAL SYMPTOMS/MOOD NEUROLOGICAL BOWEL NUTRITION STATUS      Continent Diet (see discharge summary)  AMBULATORY STATUS COMMUNICATION OF NEEDS Skin   Supervision Verbally Normal                       Personal Care Assistance Level of Assistance  Bathing,Feeding,Dressing Bathing Assistance: Independent Feeding assistance: Independent Dressing Assistance: Independent Total Care Assistance: Independent   Functional Limitations Info  Sight,Hearing,Speech Sight Info: Adequate Hearing Info: Adequate Speech Info: Adequate    SPECIAL CARE FACTORS FREQUENCY  PT (By licensed PT),OT (By licensed OT)     PT Frequency: home health will eval OT  Frequency: home health will eval            Contractures Contractures Info: Not present    Additional Factors Info  Code Status,Allergies Code Status Info: Full Allergies Info: NKA           Current Medications (07/07/2020):  This is the current hospital active medication list Current Facility-Administered Medications  Medication Dose Route Frequency Provider Last Rate Last Admin  . benztropine (COGENTIN) tablet 0.5 mg  0.5 mg Oral Daily Clapacs, John T, MD   0.5 mg at 07/07/20 0927  . carbamazepine (TEGRETOL) tablet 200 mg  200 mg Oral QHS Clapacs, John T, MD   200 mg at 07/06/20 2121  . enoxaparin (LOVENOX) injection 30 mg  30 mg Subcutaneous Q24H Agbata, Tochukwu, MD   30 mg at 07/06/20 2122  . feeding supplement (ENSURE ENLIVE / ENSURE PLUS) liquid 237 mL  237 mL Oral TID BM Sherryll Burger, Vipul, MD   237 mL at 07/07/20 1030  . hydrOXYzine (ATARAX/VISTARIL) tablet 50 mg  50 mg Oral QHS Clapacs, Jackquline Denmark, MD   50 mg at 07/06/20 2121  . lactated ringers infusion   Intravenous Continuous Meredeth Ide, MD 100 mL/hr at 07/06/20 0547 New Bag at 07/06/20 0547  . levothyroxine (SYNTHROID) tablet 100 mcg  100 mcg Oral Q0600 Agbata, Tochukwu, MD   100 mcg at 07/07/20 0511  . mirtazapine (REMERON SOL-TAB) disintegrating tablet 15 mg  15 mg Oral QHS Clapacs, Jackquline Denmark, MD   15 mg at 07/06/20 2121  . multivitamin with minerals tablet 1 tablet  1 tablet Oral Daily Delfino Lovett, MD   1 tablet  at 07/07/20 0923  . nicotine (NICODERM CQ - dosed in mg/24 hours) patch 21 mg  21 mg Transdermal Daily Agbata, Tochukwu, MD   21 mg at 07/07/20 0924  . ondansetron (ZOFRAN) tablet 4 mg  4 mg Oral Q6H PRN Agbata, Tochukwu, MD       Or  . ondansetron (ZOFRAN) injection 4 mg  4 mg Intravenous Q6H PRN Agbata, Tochukwu, MD   4 mg at 07/04/20 1153  . pantoprazole (PROTONIX) EC tablet 40 mg  40 mg Oral BID Meredeth Ide, MD   40 mg at 07/07/20 0258  . propranolol (INDERAL) tablet 10 mg  10 mg Oral TID Clapacs, Jackquline Denmark, MD   10  mg at 07/07/20 0927  . QUEtiapine (SEROQUEL) tablet 200 mg  200 mg Oral QHS Clapacs, John T, MD   200 mg at 07/06/20 2121  . sucralfate (CARAFATE) 1 GM/10ML suspension 1 g  1 g Oral TID WC & HS Toney Reil, MD   1 g at 07/07/20 0923  . tamsulosin (FLOMAX) capsule 0.4 mg  0.4 mg Oral Daily Clapacs, Jackquline Denmark, MD   0.4 mg at 07/07/20 0923  . temazepam (RESTORIL) capsule 15 mg  15 mg Oral QHS PRN Clapacs, Jackquline Denmark, MD   15 mg at 07/03/20 2139     Discharge Medications: Medication List        STOP taking these medications       divalproex 500 MG DR tablet Commonly known as: DEPAKOTE   emtricitabine-tenofovir 200-300 MG tablet Commonly known as: TRUVADA   fenofibrate 160 MG tablet   GoodSense Arthritis Pain 650 MG CR tablet Generic drug: acetaminophen Replaced by: acetaminophen 500 MG tablet   hydrOXYzine 50 MG tablet Commonly known as: ATARAX/VISTARIL   ibuprofen 600 MG tablet Commonly known as: ADVIL   oxcarbazepine 600 MG tablet Commonly known as: TRILEPTAL   paliperidone 6 MG 24 hr tablet Commonly known as: INVEGA             TAKE these medications       acetaminophen 500 MG tablet Commonly known as: TYLENOL Take 1 tablet (500 mg total) by mouth every 8 (eight) hours as needed for up to 10 days for mild pain, fever or headache. Replaces: GoodSense Arthritis Pain 650 MG CR tablet   benztropine 0.5 MG tablet Commonly known as: COGENTIN Take 0.5 mg by mouth daily.   carbamazepine 200 MG tablet Commonly known as: TEGRETOL Take 1 tablet (200 mg total) by mouth at bedtime. What changed: how much to take   feeding supplement Liqd Take 237 mLs by mouth 3 (three) times daily between meals.   haloperidol decanoate 100 MG/ML injection Commonly known as: HALDOL DECANOATE Inject 150 mg into the muscle every 30 (thirty) days.   hydrOXYzine 50 MG capsule Commonly known as: VISTARIL Take 100 mg by mouth at bedtime.   levothyroxine 100 MCG  tablet Commonly known as: SYNTHROID Take 100 mcg by mouth daily. What changed: Another medication with the same name was removed. Continue taking this medication, and follow the directions you see here.   mirtazapine 15 MG tablet Commonly known as: REMERON Take 15 mg by mouth at bedtime. What changed: Another medication with the same name was removed. Continue taking this medication, and follow the directions you see here.   multivitamin with minerals Tabs tablet Take 1 tablet by mouth daily. Start taking on: July 08, 2020   nicotine 21 mg/24hr patch Commonly known as: NICODERM CQ - dosed  in mg/24 hours Place 1 patch (21 mg total) onto the skin daily. Start taking on: July 08, 2020   omeprazole 20 MG tablet Commonly known as: PriLOSEC OTC Take 2 tablets (40 mg total) by mouth in the morning and at bedtime.   propranolol 10 MG tablet Commonly known as: INDERAL Take 10 mg by mouth 3 (three) times daily.   QUEtiapine 200 MG tablet Commonly known as: SEROQUEL Take 200 mg by mouth at bedtime.   rosuvastatin 10 MG tablet Commonly known as: CRESTOR Take 10 mg by mouth at bedtime.   sucralfate 1 g tablet Commonly known as: Carafate Take 1 tablet (1 g total) by mouth 4 (four) times daily.   tamsulosin 0.4 MG Caps capsule Commonly known as: FLOMAX Take 0.4 mg by mouth daily.   vitamin B-12 100 MCG tablet Commonly known as: CYANOCOBALAMIN Take 100 mcg by mouth daily.   vitamin C 500 MG tablet Commonly known as: ASCORBIC ACID Take 500 mg by mouth daily.   Vitamin D3 50 MCG (2000 UT) Tabs Take 1 tablet by mouth daily.      Relevant Imaging Results:  Relevant Lab Results:   Additional Information Home Health arranged with [facility]  [id number]  Allayne Butcher, RN

## 2020-07-07 NOTE — Discharge Summary (Signed)
Physician Discharge Summary  Jaime Vargas TFT:[id number] DOB: 1965/02/16 DOA: 07/01/2020  PCP[facility]  Admit date: 07/01/2020 Discharge date: 07/07/2020  Admitted From: Group home Disposition: Group home  Recommendations for Outpatient Follow-up:  1. Follow ups as below. 2. Please obtain CBC/BMP/Mag at follow up 3. Please follow up on the following pending results: None  Home Health: PT/OT Equipment/Devices: Cane  Discharge Condition: Stable CODE STATUS: Full code    Hospital Course: 55 year old M with PMH of TBI, schizoaffective disorder and hypothyroidism presented to ED from group home for evaluation of solid dysphagia and unintentional weight loss, about 20 pound weight loss over past 1 month.  CT chest/abdomen/pelvis without significant finding to explain patient's dysphagia, postprandial pain or emesis.  He was found to be in AKI with creatinine to 3.47 from baseline of 1.5.  He underwent EGD that showed erosive gastropathy with stigmata of recent bleeding, nonbleeding duodenal ulcer.  Gastric biopsy with chronic and focally active gastritis but negative for H. pylori, intestinal metaplasia, dysplasia or malignancy.  Patient was started on PPI and Carafate.  GI recommended p.o. Protonix 40 mg twice daily for 3 months and Carafate 1 g 4 times daily for 1 months.  Patient's dysphagia and AKI improved.  Patient tolerated soft diet without any issue.  Patient needs repeat EGD in 3 months to ensure healing.  GI to arrange outpatient follow-up in 2 months.  Home health PT/OT ordered as recommended by therapy.  See individual problem list below for more hospital course.  Discharge Diagnoses:  Esophageal dysphagia/postprandial pain/emesis: EGD and pathology as above.  Tolerated soft diet. -Discharged on soft diet -P.o. Prilosec 40 mg twice daily for 3 months -Carafate 1 g 4 times daily for 1 months -Outpatient follow-up with GI in 2 months -Repeat EGD in 3  months as per GI   Acute kidney injury: Baseline Cr about 1.5.  Seems to be on ibuprofen prior to presentation. Recent Labs    07/01/20 1450 07/02/20 0526 07/03/20 0502 07/04/20 0458 07/05/20 0532 07/06/20 0527 07/07/20 0531  BUN 19 17 11 11 14 20  22*  CREATININE 3.47* 3.07* 2.43* 2.12* 1.98* 1.77* 1.84*  -Encourage p.o. intake and hydration -Repeat BMP in about 1 to 2 weeks -Avoid NSAIDs.  Hypophosphatemia/hypomagnesemia: Improved. -Optimize nutrition  Normocytic anemia: Likely from malnutrition  H&H stable after initial drop likely from hemodilution.  Anemia panel consistent with anemia of chronic disease. Recent Labs    07/01/20 1450 07/02/20 0526 07/03/20 0502 07/04/20 0458  HGB 13.5 10.6* 8.9* 9.0*  -Optimize nutrition -Recheck CBC in about 1 week  Schizoaffective disorder:.  Evaluated by psych.  Stable. -On Haldol injection monthly, Seroquel, Tegretol, hydroxyzine, Remeron per psych recommendation. -Recommend outpatient psych follow-up  Hypothyroidism -Continue Synthroid  Preexposure prophylaxis for HIV: Patient was on Truvada for preexposure prophylaxis to HIV in the setting of intercourse with a HIV patient at group home who later died.  HIV antibody test was negative on 07/02/2020.  Truvada discontinued.  Severe malnutrition: As evidenced by significant muscle mass and subcu fat loss and low BMI Body mass index is 15.57 kg/m. Nutrition Problem: Severe Malnutrition Etiology: chronic illness,dysphagia Signs/Symptoms: percent weight loss,severe fat depletion,severe muscle depletion Percent weight loss: 26 % Interventions: Ensure Enlive (each supplement provides 350kcal and 20 grams of protein),Magic cup,MVI      Discharge Exam: Vitals:   07/07/20 0504 07/07/20 0811  BP: 100/72 106/70  Pulse: 74 78  Resp: 16 15  Temp: 98 F (36.7 C) 98.1  F (36.7 C)  SpO2: 98% 99%    GENERAL: No apparent distress.  Nontoxic. HEENT: MMM.  Vision and hearing  grossly intact.  NECK: Supple.  No apparent JVD.  RESP: On RA.  No IWOB.  Fair aeration bilaterally. CVS:  RRR. Heart sounds normal.  ABD/GI/GU: Bowel sounds present. Soft. Non tender.  MSK/EXT:  Moves extremities.  Significant muscle mass and subcu fat loss. SKIN: no apparent skin lesion or wound NEURO: Awake, alert and oriented appropriately.  No apparent focal neuro deficit. PSYCH: Calm. Normal affect.  Discharge Instructions   Allergies as of 07/07/2020   No Known Allergies     Medication List    STOP taking these medications   divalproex 500 MG DR tablet Commonly known as: DEPAKOTE   emtricitabine-tenofovir 200-300 MG tablet Commonly known as: TRUVADA   fenofibrate 160 MG tablet   GoodSense Arthritis Pain 650 MG CR tablet Generic drug: acetaminophen Replaced by: acetaminophen 500 MG tablet   hydrOXYzine 50 MG tablet Commonly known as: ATARAX/VISTARIL   ibuprofen 600 MG tablet Commonly known as: ADVIL   oxcarbazepine 600 MG tablet Commonly known as: TRILEPTAL   paliperidone 6 MG 24 hr tablet Commonly known as: INVEGA     TAKE these medications   acetaminophen 500 MG tablet Commonly known as: TYLENOL Take 1 tablet (500 mg total) by mouth every 8 (eight) hours as needed for up to 10 days for mild pain, fever or headache. Replaces: GoodSense Arthritis Pain 650 MG CR tablet   benztropine 0.5 MG tablet Commonly known as: COGENTIN Take 0.5 mg by mouth daily.   carbamazepine 200 MG tablet Commonly known as: TEGRETOL Take 1 tablet (200 mg total) by mouth at bedtime. What changed: how much to take   feeding supplement Liqd Take 237 mLs by mouth 3 (three) times daily between meals.   haloperidol decanoate 100 MG/ML injection Commonly known as: HALDOL DECANOATE Inject 150 mg into the muscle every 30 (thirty) days.   hydrOXYzine 50 MG capsule Commonly known as: VISTARIL Take 100 mg by mouth at bedtime.   levothyroxine 100 MCG tablet Commonly known as:  SYNTHROID Take 100 mcg by mouth daily. What changed: Another medication with the same name was removed. Continue taking this medication, and follow the directions you see here.   mirtazapine 15 MG tablet Commonly known as: REMERON Take 15 mg by mouth at bedtime. What changed: Another medication with the same name was removed. Continue taking this medication, and follow the directions you see here.   multivitamin with minerals Tabs tablet Take 1 tablet by mouth daily. Start taking on: July 08, 2020   nicotine 21 mg/24hr patch Commonly known as: NICODERM CQ - dosed in mg/24 hours Place 1 patch (21 mg total) onto the skin daily. Start taking on: July 08, 2020   omeprazole 20 MG tablet Commonly known as: PriLOSEC OTC Take 2 tablets (40 mg total) by mouth in the morning and at bedtime.   propranolol 10 MG tablet Commonly known as: INDERAL Take 10 mg by mouth 3 (three) times daily.   QUEtiapine 200 MG tablet Commonly known as: SEROQUEL Take 200 mg by mouth at bedtime.   rosuvastatin 10 MG tablet Commonly known as: CRESTOR Take 10 mg by mouth at bedtime.   sucralfate 1 g tablet Commonly known as: Carafate Take 1 tablet (1 g total) by mouth 4 (four) times daily.   tamsulosin 0.4 MG Caps capsule Commonly known as: FLOMAX Take 0.4 mg by mouth daily.  vitamin B-12 100 MCG tablet Commonly known as: CYANOCOBALAMIN Take 100 mcg by mouth daily.   vitamin C 500 MG tablet Commonly known as: ASCORBIC ACID Take 500 mg by mouth daily.   Vitamin D3 50 MCG (2000 UT) Tabs Take 1 tablet by mouth daily.            Durable Medical Equipment  (From admission, onward)         Start     Ordered   07/07/20 1039  For home use only DME Cane  Once        07/07/20 1038          Consultations:  Gastroenterology  Procedures/Studies: EGD impression: - Non-bleeding duodenal ulcers with a clean ulcer base (Forrest Class III). - Erosive gastropathy with stigmata of recent  bleeding. Biopsied. - Normal incisura and antrum. Biopsied. - Small hiatal hernia. - Esophagogastric landmarks identified. - LA Grade D reflux esophagitis with no bleeding. - Ectopic gastric mucosa in the upper third of the esophagus.  Recommendations - Return patient to hospital ward for ongoing care. - Mechanical soft diet. - Continue present medications. - Await pathology results. - Use Prilosec (omeprazole) 40 mg PO BID for 3 months. - Repeat upper endoscopy in 3 months   CT Soft Tissue Neck Wo Contrast  Result Date: 07/01/2020 CLINICAL DATA:  Dysphagia of solids EXAM: CT NECK WITHOUT CONTRAST TECHNIQUE: Multidetector CT imaging of the neck was performed following the standard protocol without intravenous contrast. COMPARISON:  None. FINDINGS: PHARYNX AND LARYNX: The nasopharynx, oropharynx and larynx are normal. Visible portions of the oral cavity, tongue base and floor of mouth are normal. Normal epiglottis, vallecula and pyriform sinuses. The larynx is normal. No retropharyngeal abscess, effusion or lymphadenopathy. SALIVARY GLANDS: Normal parotid, submandibular and sublingual glands. THYROID: Normal. LYMPH NODES: No enlarged or abnormal density lymph nodes. VASCULAR: Negative LIMITED INTRACRANIAL: Normal. VISUALIZED ORBITS: Normal. MASTOIDS AND VISUALIZED PARANASAL SINUSES: No fluid levels or advanced mucosal thickening. No mastoid effusion. SKELETON: Reversal of normal cervical lordosis may be positional or due to muscle spasm. UPPER CHEST: Clear. OTHER: None. IMPRESSION: Normal CT of the neck.  No finding to explain reported dysphagia. Electronically Signed   By: Deatra Robinson M.D.   On: 07/01/2020 20:51   CT CHEST ABDOMEN PELVIS WO CONTRAST  Result Date: 07/01/2020 CLINICAL DATA:  Postprandial abdominal pain and emesis. Weight loss. Reduced appetite. EXAM: CT CHEST, ABDOMEN AND PELVIS WITHOUT CONTRAST TECHNIQUE: Multidetector CT imaging of the chest, abdomen and pelvis was performed  following the standard protocol without IV contrast. COMPARISON:  None. FINDINGS: CT CHEST FINDINGS Cardiovascular: Minimal left anterior descending coronary artery atherosclerotic calcification. Small anterior pericardial effusion. Mediastinum/Nodes: Unremarkable Lungs/Pleura: Airway thickening is present, suggesting bronchitis or reactive airways disease. 2 mm in diameter calcified granuloma in the right lower lobe on image 125 series 3. 0.5 by 0.3 cm sub solid nodule in the right lower lobe on image 90 series 3. 2 mm calcified granuloma in the left upper lobe, image 52 series 3. Musculoskeletal: Old left third and fourth rib deformities. CT ABDOMEN PELVIS FINDINGS Hepatobiliary: Unremarkable Pancreas: Unremarkable Spleen: Unremarkable Adrenals/Urinary Tract: The adrenal glands appear unremarkable. 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 There appear to be single bilateral ureters. Mildly distended urinary bladder, probably incidental. No urinary tract calculi are identified although there is a punctate parenchymal calcification near the junction of the  fused right and left kidneys on image 82 series 4. Stomach/Bowel: Mild prominence of gas and stool in the colon. No dilated bowel or findings of malrotation. Vascular/Lymphatic: Aortoiliac atherosclerotic vascular disease. No pathologic adenopathy is identified. Reproductive: Moderately proximally retracted right testicle along the spermatic cord. Other: No supplemental non-categorized findings. Musculoskeletal: There are old deformities of the right pubic rami and of the left acetabulum and left femoral head. Left femoral IM nail with interlocking proximal screws noted. There is evidence of bilateral femoral head avascular necrosis with some femoral head irregularity on the left. Bilateral acetabular spurring, left greater than  right IMPRESSION: 1. A specific cause for the patient's postprandial abdominal pain and emesis is not identified. There is some mild prominence of gas and stool in the colon but the bowel appears otherwise unremarkable. 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 3. Aortic Atherosclerosis (ICD10-I70.0). Mild left anterior descending coronary artery atherosclerosis. 4. Old pelvic deformities. Avascular necrosis of both femoral heads with mild contour irregularity of the left femoral head. 5. Small anterior pericardial effusion. 6. Airway thickening is present, suggesting bronchitis or reactive airways disease. 7. 4 mm sub solid nodule in the right lower lobe. No follow-up recommended. This recommendation follows the consensus statement: Guidelines for Management of Incidental Pulmonary Nodules Detected on CT Images: From the Fleischner Society 2017; Radiology 2017; 284:228-243. Electronically Signed   By: Gaylyn RongWalter  Liebkemann M.D.   On: 07/01/2020 20:57       The results of significant diagnostics from this hospitalization (including imaging, microbiology, ancillary and laboratory) are listed below for reference.     Microbiology: Recent Results (from the past 240 hour(s))  SARS CORONAVIRUS 2 (TAT 6-24 HRS) Nasopharyngeal Nasopharyngeal Swab     Status: None   Collection Time: 07/01/20  6:54 PM   Specimen: Nasopharyngeal Swab  Result Value Ref Range Status   SARS Coronavirus 2 NEGATIVE NEGATIVE Final    Comment: (NOTE) SARS-CoV-2 target nucleic acids are NOT DETECTED.  The SARS-CoV-2 RNA is generally detectable in upper and lower respiratory specimens during the acute phase of infection. Negative results do not preclude SARS-CoV-2 infection, do not rule out co-infections with other pathogens, and should not be used as the sole basis for treatment or other patient management decisions. Negative results must be  combined with clinical observations, patient history, and epidemiological information. The expected result is Negative.  Fact Sheet for Patients: HairSlick.nohttps://www.fda.gov/media/138098/download  Fact Sheet for Healthcare Providers: quierodirigir.comhttps://www.fda.gov/media/138095/download  This test is not yet approved or cleared by the [place] FDA and  has been authorized for detection and/or diagnosis of SARS-CoV-2 by FDA under an Emergency Use Authorization (EUA). This EUA will remain  in effect (meaning this test can be used) for the duration of the COVID-19 declaration under Se ction 564(b)(1) of the Act, 21 U.S.C. section 360bbb-3(b)(1), unless the authorization is terminated or revoked sooner.  Performed at [facility], [address] [place], [place] [address]      Labs:  CBC: Recent Labs  Lab 07/01/20 1450 07/02/20 0526 07/03/20 0502 07/04/20 0458  WBC 10.1 8.4 5.9 8.9  HGB 13.5 10.6* 8.9* 9.0*  HCT RESULTS UNAVAILABLE DUE TO INTERFERING SUBSTANCE Unable to determine due to a cold agglutinin 24.2* 24.9*  MCV RESULTS UNAVAILABLE DUE TO INTERFERING SUBSTANCE Unable to determine due to a cold agglutinin 94.9 96.9  PLT 191 165 139* 144*   BMP &GFR Recent Labs  Lab 07/03/20  0502 07/04/20 0458 07/05/20 0532 07/06/20 0527 07/07/20 0531  NA 143 145 143 136 137  K 3.5 3.8 4.0 4.2 3.9  CL 125* 128* 125* 117* 114*  CO2 17* 15* 15* 16* 18*  GLUCOSE 97 89 95 89 88  BUN 11 11 14 20  22*  CREATININE 2.43* 2.12* 1.98* 1.77* 1.84*  CALCIUM 7.8* 7.8* 7.8* 8.1* 8.4*  MG 2.0 1.6* 1.6* 1.7 1.6*  PHOS  --  1.4* 1.9* 2.7 2.3*   Estimated Creatinine Clearance: 31.9 mL/min (A) (by C-G formula based on SCr of 1.84 mg/dL (H)). Liver & Pancreas: No results for input(s): AST, ALT, ALKPHOS, BILITOT, PROT, ALBUMIN in the last 168 hours. No results for input(s): LIPASE, AMYLASE in the last 168 hours. No results for input(s): AMMONIA in the last 168 hours. Diabetic: No results for  input(s): HGBA1C in the last 72 hours. No results for input(s): GLUCAP in the last 168 hours. Cardiac Enzymes: No results for input(s): CKTOTAL, CKMB, CKMBINDEX, TROPONINI in the last 168 hours. No results for input(s): PROBNP in the last 8760 hours. Coagulation Profile: No results for input(s): INR, PROTIME in the last 168 hours. Thyroid Function Tests: No results for input(s): TSH, T4TOTAL, FREET4, T3FREE, THYROIDAB in the last 72 hours. Lipid Profile: No results for input(s): CHOL, HDL, LDLCALC, TRIG, CHOLHDL, LDLDIRECT in the last 72 hours. Anemia Panel: Recent Labs    07/06/20 0527 07/06/20 1228  VITAMINB12  --  251  FOLATE 10.1  --   FERRITIN 219  --   TIBC 182*  --   IRON 69  --   RETICCTPCT 1.0  --    Urine analysis:    Component Value Date/Time   COLORURINE STRAW (A) 07/05/2020 1806   APPEARANCEUR CLEAR (A) 07/05/2020 1806   LABSPEC 1.010 07/05/2020 1806   PHURINE 7.0 07/05/2020 1806   GLUCOSEU >=500 (A) 07/05/2020 1806   HGBUR NEGATIVE 07/05/2020 1806   BILIRUBINUR NEGATIVE 07/05/2020 1806   KETONESUR NEGATIVE 07/05/2020 1806   PROTEINUR 30 (A) 07/05/2020 1806   NITRITE NEGATIVE 07/05/2020 1806   LEUKOCYTESUR NEGATIVE 07/05/2020 1806   Sepsis Labs: Invalid input(s): PROCALCITONIN, LACTICIDVEN   Time coordinating discharge: 40 minutes  SIGNED:  09/04/2020, MD  [facility] 07/07/2020, 10:56 AM  If 7PM-7AM, please contact night-coverage [email or web address]

## 2020-07-07 NOTE — TOC Transition Note (Signed)
Transition of Care [facility]) - CM/SW Discharge Note   Patient Details  Name: Jaime Vargas MRN: [id number] Date of Birth: 08-06-1965  Transition of Care [facility]) CM/SW Contact:  Allayne Butcher, RN Phone Number: 07/07/2020, 1:21 PM   Clinical Narrative:    Patient medically cleared for discharge back to the family care home, [facility] at [address] [name].  FL2 and discharge summary printed and placed in discharge packet for patient to return with.  Group home and Mr. Hummfreys notified of discharge.  Group home had no transportation today so [facility] will transport patient home.  They have arrived now and NT taking patient to the lobby via wheelchair.   [facility] is aware of DC today.    Final next level of care: Group Home Barriers to Discharge: Barriers Resolved   Patient Goals and CMS Choice Patient states their goals for this hospitalization and ongoing recovery are:: Patient glad to be going home with [facility] services CMS Medicare.gov Compare Post Acute Care list provided to:: Patient Choice offered to / list presented to : Patient  Discharge Placement              Patient [facility] bed at: [facility]) Patient to be transferred to facility by: [facility] Name of family member notified: Group home and Mr. Humffreys Patient and family notified of of transfer: 07/07/20  Discharge Plan and Services     Post Acute Care Choice: Skilled Nursing Facility          DME Arranged: Gilmer Mor DME Agency: [employer] Date DME Agency Contacted: 07/07/20 Time DME Agency Contacted: 1130 Representative spoke with at DME Agency: Juliette Alcide Arranged: PT,OT,Speech Therapy HH Agency: [facility] Date [facility] Agency Contacted: 07/06/20 Time HH Agency Contacted: 1400 Representative spoke with at [facility] Agency: Elnita Maxwell  Social Determinants of Health (SDOH) Interventions     Readmission Risk Interventions No  flowsheet data found.

## 2020-07-07 NOTE — Consult Note (Signed)
PHARMACY CONSULT NOTE - FOLLOW UP  Pharmacy Consult for Electrolyte Monitoring and Replacement   Recent Labs: Potassium (mmol/L)  Date Value  07/07/2020 3.9   Magnesium (mg/dL)  Date Value  46/50/3546 1.6 (L)   Calcium (mg/dL)  Date Value  56/81/2751 8.4 (L)   Albumin (g/dL)  Date Value  70/01/7492 4.5   Phosphorus (mg/dL)  Date Value  49/67/5916 2.3 (L)   Sodium (mmol/L)  Date Value  07/07/2020 137     Assessment: 55 y.o. male with medical history significant for traumatic brain injury, schizoaffective disorder, remote history of tracheostomy status post decannulation who presents to the emergency room from the rest home where he resides for evaluation of severe dysphagia for solids and significant weight loss over 1 to 2 weeks.    Mg 1.6>1.6>1.7>1.6 K 3.8>4.0>4.2>3.9 Phos 1.4>1.9>2.7>2.3 Scr 2.43>2.12>1.98>1.77>1.84  Goal of Therapy:  Electrolytes WNL  Plan:  MD ordered MgSO4 2g IV bolus and potassium phosphate 10 mmol IV  Mg, Phos, and K labs in am  Derrek Gu ,PharmD Clinical Pharmacist 07/07/2020 7:57 AM

## 2020-07-07 NOTE — Care Management Important Message (Signed)
Important Message  Patient Details  Name: Jaime Vargas MRN: [id number] Date of Birth: 12/13/1965   Medicare Important Message Given:  Yes     Olegario Messier A Krystiana Fornes 07/07/2020, 1:43 PM

## 2020-07-07 NOTE — Progress Notes (Signed)
Occupational Therapy Treatment Patient Details Name: Jaime Vargas MRN: [id number] DOB: April 19, 1965 Today's Date: 07/07/2020    History of present illness Pt is a 55 yo male that presented to ED for dysphagia, weight loss. Workup showed acute kidney injury, hydration. Underwent EGD. PMH of current smoker, bipolar disorder, schizophrenia, renal disease, hypothyroidism, heart murmur, TBI, tracheostomy.   OT comments  Jaime Vargas was seen for OT treatment on this date. Upon arrival to room pt reclined in bed, pleasant and agreeable to tx, pt requesting to shower. Pt requires SUPERVISION showering in shower, ~50% seated on chair and ~50% standing - MIN cues for safety awareness. SUPERVISION dressing standing - assist to manage lines. Pt making good progress toward goals, continues to demonstrate mild safety awareness deficits. Pt continues to benefit from skilled OT services to maximize return to PLOF and minimize risk of future falls, injury, caregiver burden, and readmission. Will continue to follow POC. Discharge recommendation remains appropriate.    Follow Up Recommendations  Home health OT;Supervision - Intermittent    Equipment Recommendations  None recommended by OT    Recommendations for Other Services      Precautions / Restrictions Precautions Precautions: Fall Restrictions Weight Bearing Restrictions: No       Mobility Bed Mobility Overal bed mobility: Modified Independent                  Transfers Overall transfer level: Modified independent Equipment used: None Transfers: Sit to/from Stand Sit to Stand: Modified independent (Device/Increase time)              Balance Overall balance assessment: Needs assistance Sitting-balance support: Feet supported Sitting balance-Leahy Scale: Good     Standing balance support: No upper extremity supported;During functional activity Standing balance-Leahy Scale: Good                             ADL  either performed or assessed with clinical judgement   ADL Overall ADL's : Needs assistance/impaired                                       General ADL Comments: SUPERVISION showering in walk in shower, ~50% seated on chair and ~50% standing - MIN cues for safety awareness. SUPERVISION dressing standing - assist to manage lines.               Cognition Arousal/Alertness: Awake/alert Behavior During Therapy: WFL for tasks assessed/performed Overall Cognitive Status: Within Functional Limits for tasks assessed                                 General Comments: decreased safety awareness        Exercises Exercises: Other exercises Other Exercises Other Exercises: Pt educated re: OT role, DME recs, d/c recs, falls prevention Other Exercises: LBD, showering, sup<>sit, sit<>stand, sitting/standing balance/tolerance           Pertinent Vitals/ Pain       Pain Assessment: No/denies pain         Frequency  Min 1X/week        Progress Toward Goals  OT Goals(current goals can now be found in the care plan section)  Progress towards OT goals: Progressing toward goals  Acute Rehab OT Goals Patient Stated Goal: to feel better OT Goal Formulation:  With patient Time For Goal Achievement: 07/19/20 Potential to Achieve Goals: Good ADL Goals Pt Will Perform Lower Body Dressing: Independently;sitting/lateral leans Pt Will Perform Tub/Shower Transfer: Tub transfer;Independently;ambulating Additional ADL Goal #1: Pt will Independently verbalize plan to implement x3 falls prevention strategies  Plan Discharge plan remains appropriate;Frequency remains appropriate       AM-PAC OT "6 Clicks" Daily Activity     Outcome Measure   Help from another person eating meals?: None Help from another person taking care of personal grooming?: None Help from another person toileting, which includes using toliet, bedpan, or urinal?: A Little Help from another  person bathing (including washing, rinsing, drying)?: A Little Help from another person to put on and taking off regular upper body clothing?: None Help from another person to put on and taking off regular lower body clothing?: A Little 6 Click Score: 21    End of Session    OT Visit Diagnosis: Other abnormalities of gait and mobility (R26.89);Muscle weakness (generalized) (M62.81)   Activity Tolerance Patient tolerated treatment well   Patient Left in bed;with call bell/phone within reach;with bed alarm set   Nurse Communication Mobility status        Time: 1093-2355 OT Time Calculation (min): 25 min  Charges: OT General Charges $OT Visit: 1 Visit OT Treatments $Self Care/Home Management : 23-37 mins   Kathie Dike, M.S. OTR/L  07/07/20, 10:35 AM  ascom [phone]

## 2020-08-09 ENCOUNTER — Emergency Department (HOSPITAL_COMMUNITY)
Admission: EM | Admit: 2020-08-09 | Discharge: 2020-08-10 | Disposition: A | Discharge status: Home / Self Care | Payer: Medicare Other | Attending: Emergency Medicine | Admitting: Emergency Medicine

## 2020-08-09 DIAGNOSIS — R456 Violent behavior: Secondary | ICD-10-CM | POA: Diagnosis not present

## 2020-08-09 DIAGNOSIS — Z79899 Other long term (current) drug therapy: Secondary | ICD-10-CM | POA: Diagnosis not present

## 2020-08-09 DIAGNOSIS — E039 Hypothyroidism, unspecified: Secondary | ICD-10-CM | POA: Diagnosis not present

## 2020-08-09 DIAGNOSIS — W19XXXA Unspecified fall, initial encounter: Secondary | ICD-10-CM | POA: Diagnosis not present

## 2020-08-09 DIAGNOSIS — R4689 Other symptoms and signs involving appearance and behavior: Secondary | ICD-10-CM

## 2020-08-09 DIAGNOSIS — Y92009 Unspecified place in unspecified non-institutional (private) residence as the place of occurrence of the external cause: Secondary | ICD-10-CM | POA: Insufficient documentation

## 2020-08-09 DIAGNOSIS — F17210 Nicotine dependence, cigarettes, uncomplicated: Secondary | ICD-10-CM | POA: Insufficient documentation

## 2020-08-10 ENCOUNTER — Other Ambulatory Visit: Payer: Self-pay

## 2020-08-10 NOTE — ED Notes (Signed)
Patient asking for a chocolate ensure
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
## 2020-08-10 NOTE — ED Provider Notes (Signed)
[facility] EMERGENCY DEPARTMENT Provider Note   CSN: [id number] Arrival date & time: 08/09/20  2356     History Chief Complaint  Patient presents with   Fall   Level 5 caveat due to psychiatric disorder Luismanuel Corman is a 55 y.o. male.   Fall     Patient with history of schizoaffective disorder presents from group home.  This report the patient fell and then became aggressive towards staff. Patient denies any complaints at this time. Past Medical History:  Diagnosis Date   Bipolar disorder (HCC)    Headache    Heart murmur    Hypothyroidism    Schizoaffective disorder (HCC)    Traumatic brain injury [facility])     Patient Active Problem List   Diagnosis Date Noted   Protein-calorie malnutrition, severe 07/02/2020   AKI (acute kidney injury) (HCC) 07/01/2020   Dysphagia 07/01/2020   Hypothyroidism 07/01/2020   Tobacco use disorder 10/20/2014   Schizoaffective disorder, bipolar type (HCC) 10/19/2014   Traumatic brain injury (HCC) 10/19/2014    Past Surgical History:  Procedure Laterality Date   ESOPHAGOGASTRODUODENOSCOPY (EGD) WITH PROPOFOL N/A 07/02/2020   Procedure: ESOPHAGOGASTRODUODENOSCOPY (EGD) WITH PROPOFOL;  Surgeon: Toney Reil, MD;  Location: [facility] ENDOSCOPY;  Service: Gastroenterology;  Laterality: N/A;       Family History  Problem Relation Age of Onset   Schizophrenia Father     Social History   Tobacco Use   Smoking status: Every Day    Packs/day: 1.00    Years: 39.00    Pack years: 39.00    Types: Cigarettes   Smokeless tobacco: Never  Vaping Use   Vaping Use: Never used  Substance Use Topics   Alcohol use: No   Drug use: No    Comment: Patient reports past use    Home Medications Prior to Admission medications   Medication Sig Start Date End Date Taking? Authorizing Provider  benztropine (COGENTIN) 0.5 MG tablet Take 0.5 mg by mouth daily. 05/30/20   [provider]  carbamazepine (TEGRETOL) 200 MG tablet Take 1  tablet (200 mg total) by mouth at bedtime. 07/07/20   Almon Hercules, MD  Cholecalciferol (VITAMIN D3) 50 MCG (2000 UT) TABS Take 1 tablet by mouth daily. 05/30/20   [provider]  haloperidol decanoate (HALDOL DECANOATE) 100 MG/ML injection Inject 150 mg into the muscle every 30 (thirty) days. 06/10/20   [provider]  hydrOXYzine (VISTARIL) 50 MG capsule Take 100 mg by mouth at bedtime. 05/30/20   [provider]  levothyroxine (SYNTHROID) 100 MCG tablet Take 100 mcg by mouth daily. 05/30/20   [provider]  mirtazapine (REMERON) 15 MG tablet Take 15 mg by mouth at bedtime. 06/16/20   [provider]  Multiple Vitamin (MULTIVITAMIN WITH MINERALS) TABS tablet Take 1 tablet by mouth daily. 07/08/20   Almon Hercules, MD  nicotine (NICODERM CQ - DOSED IN MG/24 HOURS) 21 mg/24hr patch Place 1 patch (21 mg total) onto the skin daily. 07/08/20   Almon Hercules, MD  omeprazole (PRILOSEC OTC) 20 MG tablet Take 2 tablets (40 mg total) by mouth in the morning and at bedtime. 07/07/20   Almon Hercules, MD  propranolol (INDERAL) 10 MG tablet Take 10 mg by mouth 3 (three) times daily. 05/30/20   [provider]  QUEtiapine (SEROQUEL) 200 MG tablet Take 200 mg by mouth at bedtime. 05/30/20   [provider]  rosuvastatin (CRESTOR) 10 MG tablet Take 10 mg by mouth  at bedtime. 05/30/20   [provider]  sucralfate (CARAFATE) 1 g tablet Take 1 tablet (1 g total) by mouth 4 (four) times daily. 07/07/20 07/07/21  Almon Hercules, MD  tamsulosin (FLOMAX) 0.4 MG CAPS capsule Take 0.4 mg by mouth daily. 05/30/20   [provider]  vitamin B-12 (CYANOCOBALAMIN) 100 MCG tablet Take 100 mcg by mouth daily. 05/30/20   [provider]  vitamin C (ASCORBIC ACID) 500 MG tablet Take 500 mg by mouth daily. 05/30/20   [provider]    Allergies    Patient has no known allergies.  Review of Systems   Review of Systems  Unable to perform ROS: Psychiatric  disorder   Physical Exam Updated Vital Signs BP 114/62   Pulse 92   Temp 99.5 F (37.5 C) (Oral)   Resp 18   Ht 1.765 m (5' 9.5")   Wt 49 kg   SpO2 99%   BMI 15.72 kg/m   Physical Exam CONSTITUTIONAL: Elderly, no acute distress HEAD: Normocephalic/atraumatic ENMT: Mucous membranes moist NECK: supple no meningeal signs SPINE/BACK:entire spine nontender LUNGS: no apparent distress ABDOMEN: soft, nontender NEURO: Pt is awake/alert, moves all extremitiesx4.  No facial droop.  Patient is ambulatory EXTREMITIES: pulses normal/equal, full ROM, no deformities or tenderness SKIN: warm, color normal  ED Results / Procedures / Treatments   Labs (all labs ordered are listed, but only abnormal results are displayed) Labs Reviewed - No data to display  EKG None  Radiology No results found.  Procedures Procedures   Medications Ordered in ED Medications - No data to display  ED Course  I have reviewed the triage vital signs and the nursing notes.    MDM Rules/Calculators/A&P                          Patient presents from group home for reportedly falling and becoming aggressive.  There is no signs of trauma patient is walking Apparently patient has previous history of aggressive behavior.  No indication to keep patient Emergency Department.  He has wanted to go outside and smoke, patient was informed he cannot do this.  We will call for transport back to facility Final Clinical Impression(s) / ED Diagnoses Final diagnoses:  Aggressive behavior    Rx / DC Orders ED Discharge Orders     None        Zadie Rhine, MD 08/10/20 [id number]

## 2020-08-10 NOTE — ED Notes (Addendum)
Patient ambulated to restroom with assistance of cane and standby assistance. Patient able to give urine specimen at this time. Patient states that it burns when he urinates. Patient returned to room at this time. Urine specimen collected and labeled in room.

## 2020-08-10 NOTE — ED Triage Notes (Signed)
Pt had 2 falls at facility today. Pt denies any pain.

## 2020-08-10 NOTE — ED Notes (Signed)
Pt not complaining of any pain. Pt states that he wants to go outside to smoke. Pt educated that he can not go outside. Pt facility called to try and get more information about why pt is here. No answer. Will continue to monitor pt.

## 2020-08-10 NOTE — ED Notes (Signed)
Larkin Ina states someone is on the way to pick up the pt. [phone]

## 2020-08-16 ENCOUNTER — Ambulatory Visit: Payer: Medicare Other | Admitting: Gastroenterology

## 2020-09-22 ENCOUNTER — Emergency Department (HOSPITAL_COMMUNITY): Payer: Medicare Other

## 2020-09-22 ENCOUNTER — Emergency Department (HOSPITAL_COMMUNITY)
Admission: EM | Admit: 2020-09-22 | Discharge: 2020-09-22 | Disposition: A | Discharge status: Home / Self Care | Payer: Medicare Other | Attending: Emergency Medicine | Admitting: Emergency Medicine

## 2020-09-22 ENCOUNTER — Encounter (HOSPITAL_COMMUNITY): Payer: Self-pay | Admitting: *Deleted

## 2020-09-22 ENCOUNTER — Other Ambulatory Visit: Payer: Self-pay

## 2020-09-22 DIAGNOSIS — Z79899 Other long term (current) drug therapy: Secondary | ICD-10-CM | POA: Diagnosis not present

## 2020-09-22 DIAGNOSIS — E039 Hypothyroidism, unspecified: Secondary | ICD-10-CM | POA: Diagnosis not present

## 2020-09-22 DIAGNOSIS — S82302A Unspecified fracture of lower end of left tibia, initial encounter for closed fracture: Secondary | ICD-10-CM | POA: Insufficient documentation

## 2020-09-22 DIAGNOSIS — S60222A Contusion of left hand, initial encounter: Secondary | ICD-10-CM | POA: Insufficient documentation

## 2020-09-22 DIAGNOSIS — F17210 Nicotine dependence, cigarettes, uncomplicated: Secondary | ICD-10-CM | POA: Insufficient documentation

## 2020-09-22 DIAGNOSIS — S82832A Other fracture of upper and lower end of left fibula, initial encounter for closed fracture: Secondary | ICD-10-CM | POA: Insufficient documentation

## 2020-09-22 DIAGNOSIS — M79672 Pain in left foot: Secondary | ICD-10-CM | POA: Diagnosis not present

## 2020-09-22 DIAGNOSIS — S60221A Contusion of right hand, initial encounter: Secondary | ICD-10-CM | POA: Insufficient documentation

## 2020-09-22 DIAGNOSIS — S99922A Unspecified injury of left foot, initial encounter: Secondary | ICD-10-CM | POA: Diagnosis present

## 2020-09-22 MED ORDER — TETANUS-DIPHTH-ACELL PERTUSSIS 5-2.5-18.5 LF-MCG/0.5 IM SUSY: 0.5000 mL | PREFILLED_SYRINGE | Freq: Once | INTRAMUSCULAR | Status: DC

## 2020-09-22 MED ORDER — NICOTINE 21 MG/24HR TD PT24
21.0000 mg | MEDICATED_PATCH | Freq: Once | TRANSDERMAL | Status: DC
  Administered 2020-09-22: 21 mg via TRANSDERMAL
  Filled 2020-09-22: qty 1

## 2020-09-22 NOTE — ED Notes (Addendum)
Pt. Requested a wheelchair to go outside to smoke. This nurse informed pt. That their was no wheelchair available at this time and that their foot needed to be splinted. This nurse was able to obtain a nicotine patch for pt.
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
## 2020-09-22 NOTE — Discharge Instructions (Addendum)
It was a pleasure taking care of you today.  As discussed, you broke 2 bones in your left ankle.  You most likely will need surgery.  I have included the number of the orthopedic surgeon.  Please call tomorrow to schedule an appointment for further evaluation.  Do not bear any weight on your left foot.  Continue to ice and elevate your left leg.  Return to the ER for new or worsening symptoms.

## 2020-09-22 NOTE — ED Notes (Addendum)
Pt. Refusing to sign ortho paperwork stating " I paid 10 fucking million dollars." Pt. Refuses to be admitted. Pt. Is being discharged and escorted off the hospital grounds due to being verbally abusive toward staff. Pt. Refused to let us obtain vitals. Pt. Refused to sign the discharge paperwork.

## 2020-09-22 NOTE — ED Notes (Signed)
Pt. Told this nurse " You bitch you said you would get me a wheelchair." This nurse told pt. That all the wheelchairs were currently being used.

## 2020-09-22 NOTE — ED Notes (Addendum)
Pt. Is being verbally abusive toward staff stating " You bitch you are going to hell." Per pt. " Your a stupid bitch and [place] police are going to come and arrest you. They brought me up here you'll see. You bitch, you're going to hell." This nurse was applying a splint to pts. Foot. This nurse asked pt. To stop saying bitch. Pt. Refused to stop.

## 2020-09-22 NOTE — ED Notes (Signed)
Pt hitting the side of the bed . Yelling " you better hurry up and get my wheelchair". Nurse approached pt and pt stated " I am ready to leave and I have been waiting for hours just for a wheelchair. ". Nurse asked pt if he was discharged and he stated yes,. Nurse asked why did he have hospital clothes on. " I am taking them home. "
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
## 2020-09-23 ENCOUNTER — Telehealth: Payer: Self-pay | Admitting: Orthopedic Surgery

## 2020-09-23 NOTE — Telephone Encounter (Signed)
Call received from Jo at [facility], ph# [phone] regarding Emergency room visit for problem: IMPRESSION:  Comminuted, displaced fracture in the distal tibial metaphysis.   -notes indicate discussed with Dr Romeo Apple:   "Discussed with Dr. Romeo Apple with orthopedics who recommends splint and outpatient follow-up given patient declined admission. Splint placed."  Please advise if we are to schedule  ? today, 09/23/20?

## 2020-09-23 NOTE — Telephone Encounter (Signed)
Called back to [facility] facility, offered appointment for the 2 week follow up; due to transportation, scheduled for the following Monday, 10/11/20, 1:30pm, per Alvino Chapel, ph# [phone].

## 2020-10-11 ENCOUNTER — Other Ambulatory Visit: Payer: Self-pay

## 2020-10-11 ENCOUNTER — Ambulatory Visit (INDEPENDENT_AMBULATORY_CARE_PROVIDER_SITE_OTHER): Payer: Medicare Other | Admitting: Orthopedic Surgery

## 2020-10-11 ENCOUNTER — Encounter: Payer: Self-pay | Admitting: Orthopedic Surgery

## 2020-10-11 ENCOUNTER — Ambulatory Visit: Payer: Medicare Other

## 2020-10-11 VITALS — BP 142/89 | HR 94 | Ht 69.5 in | Wt 136.0 lb

## 2020-10-11 DIAGNOSIS — S82302A Unspecified fracture of lower end of left tibia, initial encounter for closed fracture: Secondary | ICD-10-CM

## 2020-10-11 DIAGNOSIS — S82832A Other fracture of upper and lower end of left fibula, initial encounter for closed fracture: Secondary | ICD-10-CM

## 2020-10-11 DIAGNOSIS — M25572 Pain in left ankle and joints of left foot: Secondary | ICD-10-CM

## 2020-10-11 NOTE — Progress Notes (Signed)
Chief Complaint  Patient presents with   Ankle Injury    Left ankle fracture 09/22/20     55 year old male prisoner schizophrenic injured his ankle and lower leg on August 24 was placed in a splint taken back to [place] [facility] for disorderly conduct he sustained a closed left distal tib-fib fracture he is here for evaluation and management  He does not complain of pain but he has obvious deformity of his left lower extremity his skin is intact with no blisters  BP (!) 142/89   Pulse 94   Ht 5' 9.5" (1.765 m)   Wt 136 lb (61.7 kg)   BMI 19.80 kg/m   He is lucid but seems to be talking about things not related to his illness or current problem probably disoriented to time  His skin is intact he has minimal tenderness medial and lateral side of the ankle but obvious deformity neurovascular and muscular exam is intact  X-ray from August 24 I interpret this as a comminuted fracture of the left distal tibia and fibula with angulation in both planes  An x-ray was repeated today he has a varus deformity comminution of the distal tibia and a comminuted segmental fibular fracture  He was placed in a posterior and sugar-tong splint with an attempted closed reduction with mild improvement in alignment but continued deformity  This is obviously a very difficult fracture and the patient has schizophrenia  Definitive management will be determined I have placed the request for advice from a foot and ankle specialist and will follow-up   Past Medical History:  Diagnosis Date   Bipolar disorder (HCC)    Headache    Heart murmur    Hypothyroidism    Schizoaffective disorder (HCC)    Traumatic brain injury (HCC)    Past Surgical History:  Procedure Laterality Date   ESOPHAGOGASTRODUODENOSCOPY (EGD) WITH PROPOFOL N/A 07/02/2020   Procedure: ESOPHAGOGASTRODUODENOSCOPY (EGD) WITH PROPOFOL;  Surgeon: Toney Reil, MD;  Location: [facility] ENDOSCOPY;  Service: Gastroenterology;  Laterality: N/A;     Current Outpatient Medications:    benztropine (COGENTIN) 0.5 MG tablet, Take 0.5 mg by mouth daily., Disp: , Rfl:    carbamazepine (TEGRETOL) 200 MG tablet, Take 1 tablet (200 mg total) by mouth at bedtime., Disp: 90 tablet, Rfl: 1   Cholecalciferol (VITAMIN D3) 50 MCG (2000 UT) TABS, Take 1 tablet by mouth daily., Disp: , Rfl:    emtricitabine-tenofovir (TRUVADA) 200-300 MG tablet, Take 1 tablet by mouth daily., Disp: , Rfl:    fenofibrate 160 MG tablet, Take 160 mg by mouth daily., Disp: , Rfl:    haloperidol decanoate (HALDOL DECANOATE) 100 MG/ML injection, Inject 150 mg into the muscle every 30 (thirty) days., Disp: , Rfl:    hydrOXYzine (VISTARIL) 50 MG capsule, Take 100 mg by mouth at bedtime., Disp: , Rfl:    levothyroxine (SYNTHROID) 100 MCG tablet, Take 100 mcg by mouth daily., Disp: , Rfl:    mirtazapine (REMERON) 15 MG tablet, Take 15 mg by mouth at bedtime., Disp: , Rfl:    Multiple Vitamin (MULTIVITAMIN WITH MINERALS) TABS tablet, Take 1 tablet by mouth daily., Disp: 100 tablet, Rfl: 1   nicotine (NICODERM CQ - DOSED IN MG/24 HOURS) 21 mg/24hr patch, Place 1 patch (21 mg total) onto the skin daily., Disp: 28 patch, Rfl: 0   omeprazole (PRILOSEC OTC) 20 MG tablet, Take 2 tablets (40 mg total) by mouth in the morning and at bedtime., Disp: 360 tablet, Rfl: 0   propranolol (  INDERAL) 10 MG tablet, Take 10 mg by mouth 3 (three) times daily., Disp: , Rfl:    QUEtiapine (SEROQUEL) 200 MG tablet, Take 200 mg by mouth at bedtime., Disp: , Rfl:    rosuvastatin (CRESTOR) 10 MG tablet, Take 10 mg by mouth at bedtime., Disp: , Rfl:    sucralfate (CARAFATE) 1 g tablet, Take 1 tablet (1 g total) by mouth 4 (four) times daily., Disp: 120 tablet, Rfl: 0   tamsulosin (FLOMAX) 0.4 MG CAPS capsule, Take 0.4 mg by mouth daily., Disp: , Rfl:    vitamin B-12 (CYANOCOBALAMIN) 100 MCG tablet, Take 100 mcg by mouth daily., Disp: , Rfl:    vitamin C (ASCORBIC ACID) 500 MG tablet, Take 500 mg by mouth  daily., Disp: , Rfl:  Family History  Problem Relation Age of Onset   Schizophrenia Father    Social History   Tobacco Use   Smoking status: Every Day    Packs/day: 1.00    Years: 39.00    Pack years: 39.00    Types: Cigarettes   Smokeless tobacco: Never  Vaping Use   Vaping Use: Never used  Substance Use Topics   Alcohol use: No   Drug use: No    Comment: Patient reports past use

## 2020-10-21 ENCOUNTER — Ambulatory Visit (INDEPENDENT_AMBULATORY_CARE_PROVIDER_SITE_OTHER): Payer: Medicare Other | Admitting: Orthopedic Surgery

## 2020-10-21 ENCOUNTER — Ambulatory Visit: Payer: Self-pay

## 2020-10-21 ENCOUNTER — Other Ambulatory Visit: Payer: Self-pay

## 2020-10-21 DIAGNOSIS — M25572 Pain in left ankle and joints of left foot: Secondary | ICD-10-CM

## 2020-10-21 DIAGNOSIS — S82872A Displaced pilon fracture of left tibia, initial encounter for closed fracture: Secondary | ICD-10-CM | POA: Diagnosis not present

## 2020-11-08 ENCOUNTER — Encounter: Payer: Self-pay | Admitting: Orthopedic Surgery

## 2020-11-08 NOTE — Progress Notes (Signed)
Office Visit Note   Patient: Jaime Vargas           Date of Birth: Nov 08, 1965           MRN: [id number] Visit Date: 10/21/2020              Requested by: Vickki Hearing, MD [address] [place],  [place] [address] PCP: Pcp, No  Chief Complaint  Patient presents with   Left Ankle - Pain    09/22/20 left ankle fx 09/22/20 referral fro Dr. Romeo Apple       HPI: Patient is a 55 year old gentleman who is seen for initial evaluation for comminuted pilon fracture left ankle.  Patient sustained the injury on August 24.  Patient referred from Dr. Romeo Apple  Patient was initially placed in a splint he currently does not have a splint in place he remove the splint and has been walking on his foot without support and no complaints of discomfort.  Patient has a history of smoking and a history of traumatic brain trauma with schizoaffective disorder.  Assessment & Plan: Visit Diagnoses:  1. Pain in left ankle and joints of left foot   2. Closed pilon fracture, left, initial encounter     Plan: With patient's lack of symptoms and difficulty with compliance we will place him in a fracture boot with follow-up in 4 weeks with repeat radiographs.  Follow-Up Instructions: Return in about 4 weeks (around 11/18/2020).   Ortho Exam  Patient is alert, oriented, no adenopathy, well-dressed, normal affect, normal respiratory effort. Examination there are are good palpable pulses there are no ulcers.  The fracture is stable..  The foot is plantigrade.  No cellulitis no signs of infection.  Imaging: No results found. No images are attached to the encounter.  Labs: Lab Results  Component Value Date   HGBA1C 5.4 10/21/2014     Lab Results  Component Value Date   ALBUMIN 4.5 11/05/2014   ALBUMIN 4.2 10/29/2014   ALBUMIN 4.3 10/19/2014    Lab Results  Component Value Date   MG 1.6 (L) 07/07/2020   MG 1.7 07/06/2020   MG 1.6 (L) 07/05/2020   No results found for: VD25OH  No  results found for: PREALBUMIN CBC EXTENDED Latest Ref Rng & Units 07/06/2020 07/04/2020 07/03/2020  WBC 4.0 - 10.5 K/uL - 8.9 5.9  RBC 4.22 - 5.81 MIL/uL 2.64(L) 2.57(L) 2.55(L)  HGB 13.0 - 17.0 g/dL - 9.0(L) 8.9(L)  HCT 39.0 - 52.0 % - 24.9(L) 24.2(L)  PLT 150 - 400 K/uL - 144(L) 139(L)  NEUTROABS 1.4 - 6.5 K/uL - - -  LYMPHSABS 1.0 - 3.6 K/uL - - -     There is no height or weight on file to calculate BMI.  Orders:  Orders Placed This Encounter  Procedures   XR Ankle Complete Left   No orders of the defined types were placed in this encounter.    Procedures: No procedures performed  Clinical Data: No additional findings.  ROS:  All other systems negative, except as noted in the HPI. Review of Systems  Objective: Vital Signs: There were no vitals taken for this visit.  Specialty Comments:  No specialty comments available.  PMFS History: Patient Active Problem List   Diagnosis Date Noted   Protein-calorie malnutrition, severe 07/02/2020   AKI (acute kidney injury) (HCC) 07/01/2020   Dysphagia 07/01/2020   Hypothyroidism 07/01/2020   Primary osteoarthritis involving multiple joints 03/12/2017   SOB (shortness of breath) 03/12/2017  Tobacco use disorder 10/20/2014   Schizoaffective disorder, bipolar type (HCC) 10/19/2014   Traumatic brain injury (HCC) 10/19/2014   Past Medical History:  Diagnosis Date   Bipolar disorder (HCC)    Headache    Heart murmur    Hypothyroidism    Schizoaffective disorder (HCC)    Traumatic brain injury (HCC)     Family History  Problem Relation Age of Onset   Schizophrenia Father     Past Surgical History:  Procedure Laterality Date   ESOPHAGOGASTRODUODENOSCOPY (EGD) WITH PROPOFOL N/A 07/02/2020   Procedure: ESOPHAGOGASTRODUODENOSCOPY (EGD) WITH PROPOFOL;  Surgeon: Toney Reil, MD;  Location: [facility] ENDOSCOPY;  Service: Gastroenterology;  Laterality: N/A;   Social History   Occupational History   Not on file  Tobacco  Use   Smoking status: Every Day    Packs/day: 1.00    Years: 39.00    Pack years: 39.00    Types: Cigarettes   Smokeless tobacco: Never  Vaping Use   Vaping Use: Never used  Substance and Sexual Activity   Alcohol use: No   Drug use: No    Comment: Patient reports past use   Sexual activity: Not Currently    Birth control/protection: None

## 2020-11-18 ENCOUNTER — Ambulatory Visit (INDEPENDENT_AMBULATORY_CARE_PROVIDER_SITE_OTHER): Payer: Medicare Other | Admitting: Orthopedic Surgery

## 2020-11-18 ENCOUNTER — Ambulatory Visit: Payer: Self-pay

## 2020-11-18 ENCOUNTER — Encounter: Payer: Self-pay | Admitting: Orthopedic Surgery

## 2020-11-18 DIAGNOSIS — S82302A Unspecified fracture of lower end of left tibia, initial encounter for closed fracture: Secondary | ICD-10-CM

## 2020-11-18 DIAGNOSIS — S82832A Other fracture of upper and lower end of left fibula, initial encounter for closed fracture: Secondary | ICD-10-CM | POA: Diagnosis not present

## 2020-11-18 NOTE — Progress Notes (Signed)
Office Visit Note   Patient: Jaime Vargas           Date of Birth: 1965-10-08           MRN: [id number] Visit Date: 11/18/2020              Requested by: No referring provider defined for this encounter. PCP: Pcp, No  Chief Complaint  Patient presents with   Left Ankle - Follow-up    DOI 09/02/20 ankle fx       HPI: Patient is a 55 year old gentleman who presents in follow-up for a pilon segmental fibular fracture.  Patient was unable to use the immobilization he discarded the fracture boot and has been full weightbearing.  Assessment & Plan: Visit Diagnoses:  1. Closed fracture of distal end of left fibula and tibia, initial encounter     Plan: Discussed with the patient the bone has completely healed.  He has no complaints with weightbearing at this time we will follow-up as needed.  Follow-Up Instructions: Return if symptoms worsen or fail to improve.   Ortho Exam  Patient is alert, oriented, no adenopathy, well-dressed, normal affect, normal respiratory effort. Examination of the left foot the hindfoot is in varus with the patient standing his foot is plantigrade there is no ulcers no cellulitis no signs of infection.  He does have some dry dermatitis on the heel most likely due to fungal infection from the flip-flops.  Imaging: XR Ankle Complete Left  Result Date: 11/18/2020 Three-view radiographs of the left ankle shows exuberant callus formation around the pilon tibia fracture and the multilevel fibular fracture.  The mortise is congruent the hindfoot is in varus.  With neutral alignment in the lateral view without flexion or extension.  No images are attached to the encounter.  Labs: Lab Results  Component Value Date   HGBA1C 5.4 10/21/2014     Lab Results  Component Value Date   ALBUMIN 4.5 11/05/2014   ALBUMIN 4.2 10/29/2014   ALBUMIN 4.3 10/19/2014    Lab Results  Component Value Date   MG 1.6 (L) 07/07/2020   MG 1.7 07/06/2020   MG 1.6 (L)  07/05/2020   No results found for: VD25OH  No results found for: PREALBUMIN CBC EXTENDED Latest Ref Rng & Units 07/06/2020 07/04/2020 07/03/2020  WBC 4.0 - 10.5 K/uL - 8.9 5.9  RBC 4.22 - 5.81 MIL/uL 2.64(L) 2.57(L) 2.55(L)  HGB 13.0 - 17.0 g/dL - 9.0(L) 8.9(L)  HCT 39.0 - 52.0 % - 24.9(L) 24.2(L)  PLT 150 - 400 K/uL - 144(L) 139(L)  NEUTROABS 1.4 - 6.5 K/uL - - -  LYMPHSABS 1.0 - 3.6 K/uL - - -     There is no height or weight on file to calculate BMI.  Orders:  Orders Placed This Encounter  Procedures   XR Ankle Complete Left   No orders of the defined types were placed in this encounter.    Procedures: No procedures performed  Clinical Data: No additional findings.  ROS:  All other systems negative, except as noted in the HPI. Review of Systems  Objective: Vital Signs: There were no vitals taken for this visit.  Specialty Comments:  No specialty comments available.  PMFS History: Patient Active Problem List   Diagnosis Date Noted   Protein-calorie malnutrition, severe 07/02/2020   AKI (acute kidney injury) (HCC) 07/01/2020   Dysphagia 07/01/2020   Hypothyroidism 07/01/2020   Primary osteoarthritis involving multiple joints 03/12/2017   SOB (shortness of breath) 03/12/2017  Tobacco use disorder 10/20/2014   Schizoaffective disorder, bipolar type (HCC) 10/19/2014   Traumatic brain injury (HCC) 10/19/2014   Past Medical History:  Diagnosis Date   Bipolar disorder (HCC)    Headache    Heart murmur    Hypothyroidism    Schizoaffective disorder (HCC)    Traumatic brain injury     Family History  Problem Relation Age of Onset   Schizophrenia Father     Past Surgical History:  Procedure Laterality Date   ESOPHAGOGASTRODUODENOSCOPY (EGD) WITH PROPOFOL N/A 07/02/2020   Procedure: ESOPHAGOGASTRODUODENOSCOPY (EGD) WITH PROPOFOL;  Surgeon: Toney Reil, MD;  Location: [facility] ENDOSCOPY;  Service: Gastroenterology;  Laterality: N/A;   Social History    Occupational History   Not on file  Tobacco Use   Smoking status: Every Day    Packs/day: 1.00    Years: 39.00    Pack years: 39.00    Types: Cigarettes   Smokeless tobacco: Never  Vaping Use   Vaping Use: Never used  Substance and Sexual Activity   Alcohol use: No   Drug use: No    Comment: Patient reports past use   Sexual activity: Not Currently    Birth control/protection: None

## 2022-06-27 IMAGING — CT CT CHEST-ABD-PELV W/O CM
2 of 4 series · 13 of 36 positions shown, 15 images · non-contrast
Comparison: None.

CLINICAL DATA: Postprandial abdominal pain and emesis. Weight loss.
Reduced appetite.

EXAM:
CT CHEST, ABDOMEN AND PELVIS WITHOUT CONTRAST
TECHNIQUE: Multidetector CT imaging of the chest, abdomen and pelvis was
performed following the standard protocol without IV contrast.

[Series 4: cap (person_name) (person_name) · axial · 0.73mm/px · z∈[-311,+264]mm · 10 of 139 slices shown, 12 images]
[im 12/139  mediastinal]
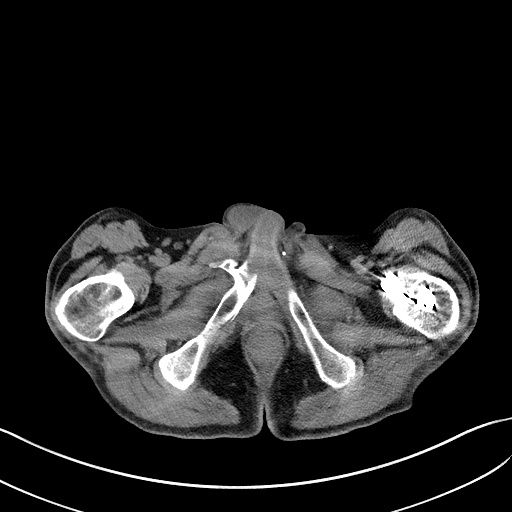
[im 12/139  bone]
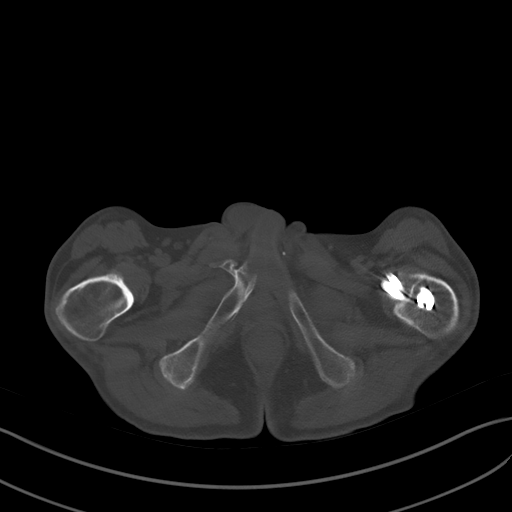
[im 24/139  mediastinal]
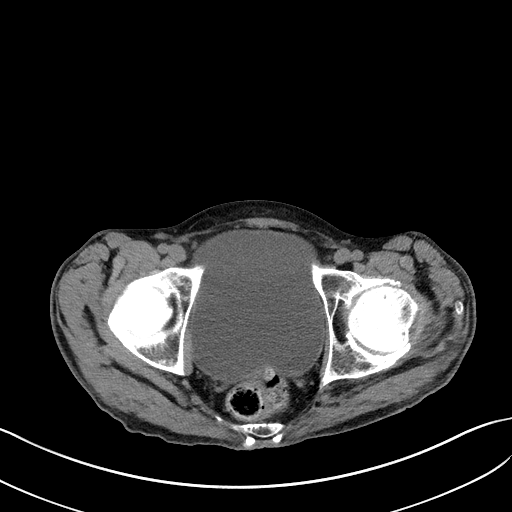
[im 35/139  mediastinal]
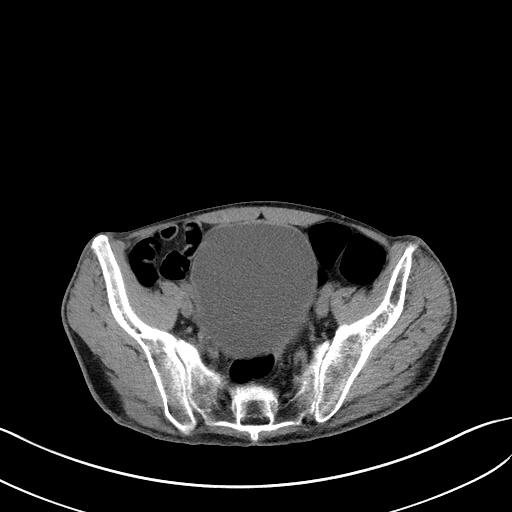
[im 47/139  mediastinal]
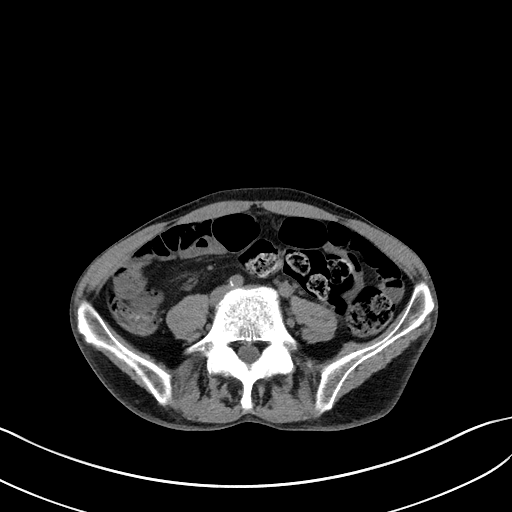
[im 58/139  mediastinal]
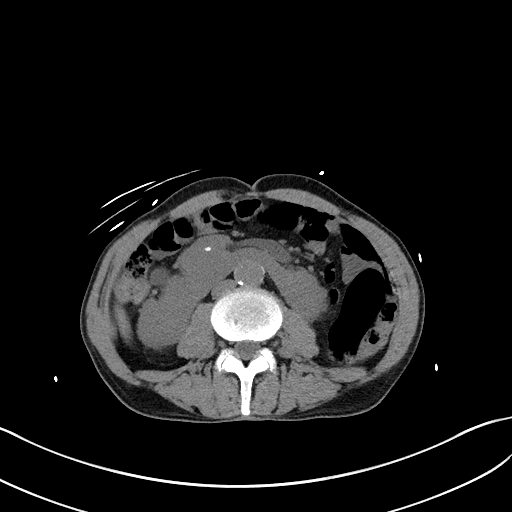
[im 81/139  mediastinal]
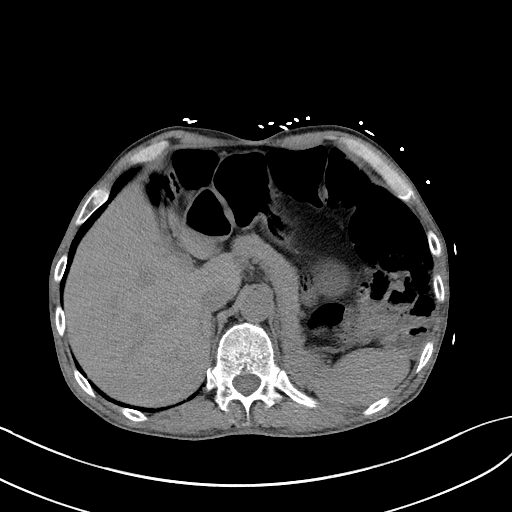
[im 93/139  mediastinal]
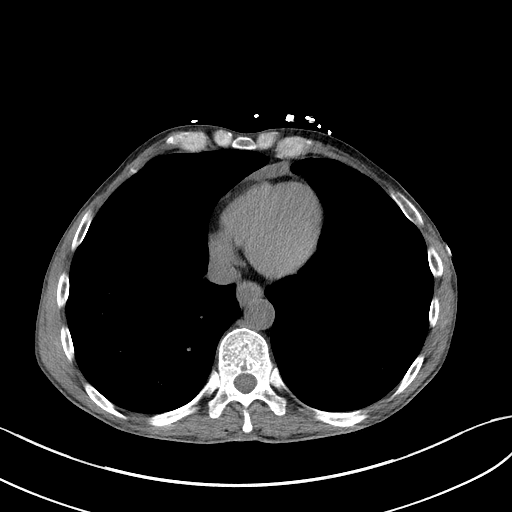
[im 104/139  mediastinal]
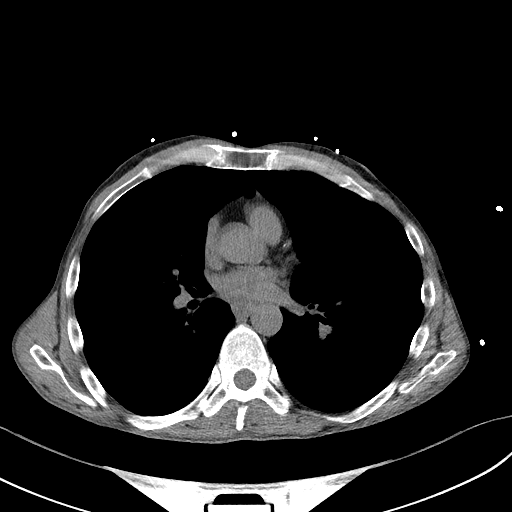
[im 116/139  mediastinal]
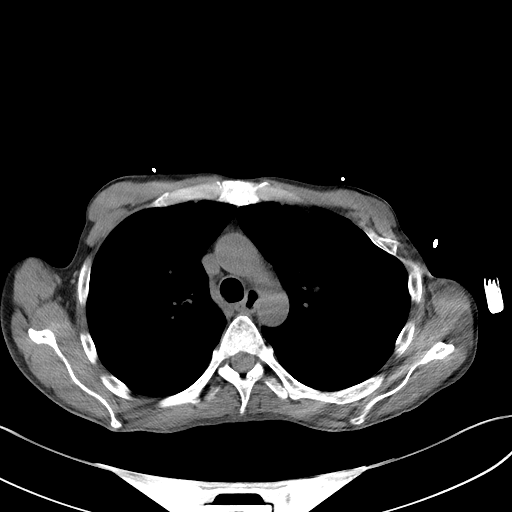
[im 116/139  bone]
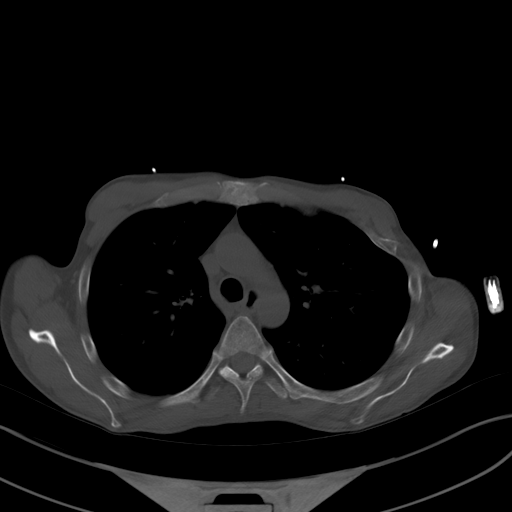
[im 127/139  mediastinal]
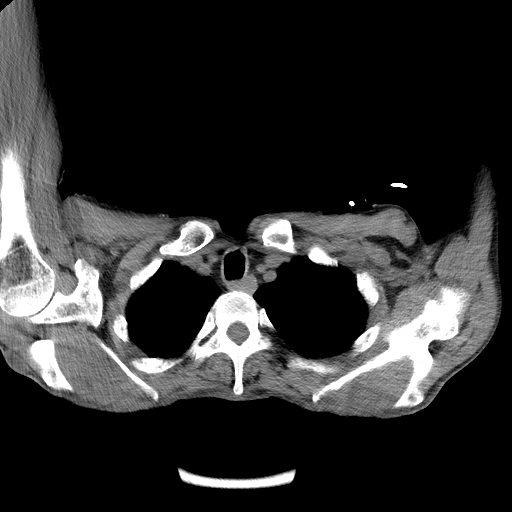

[Series 6: coronal · coronal · 0.71mm/px · 3 of 107 slices shown]
[im 22/107  mediastinal]
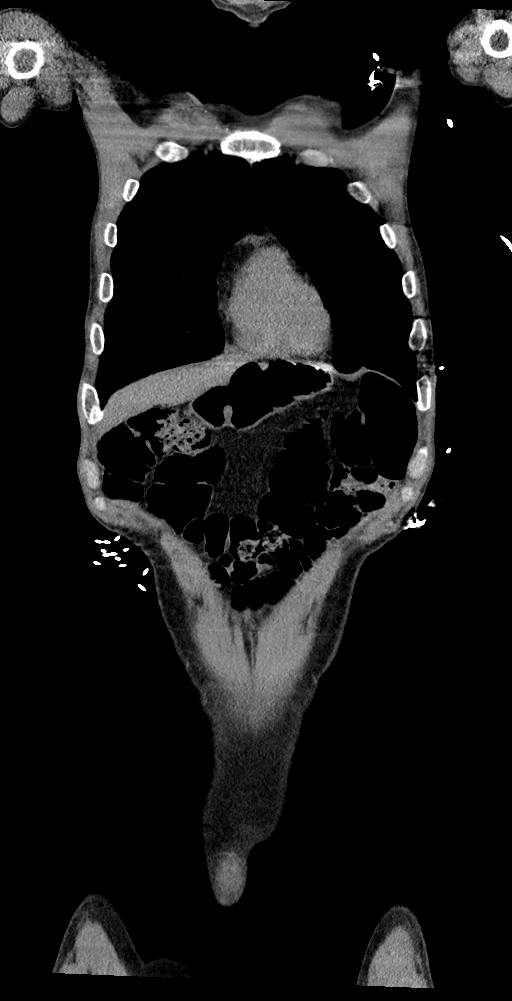
[im 43/107  mediastinal]
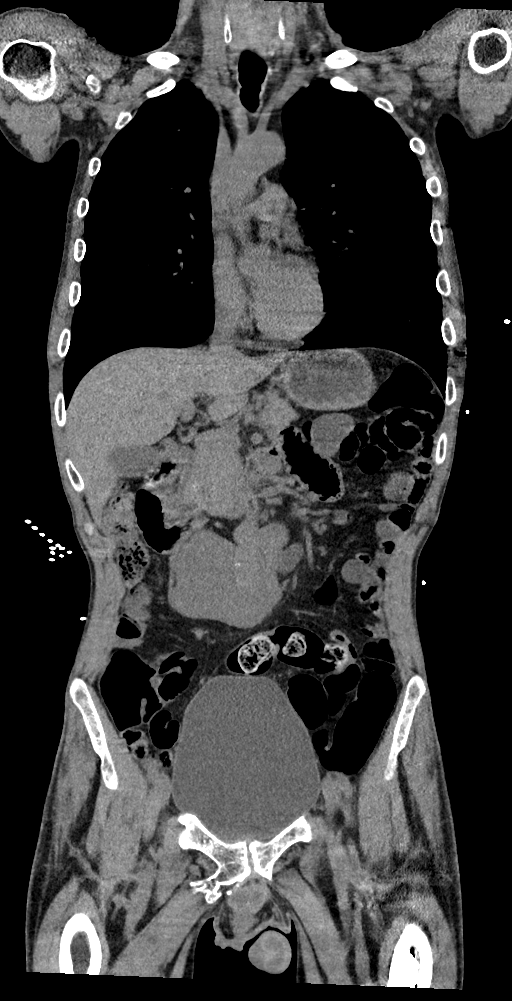
[im 64/107  mediastinal]
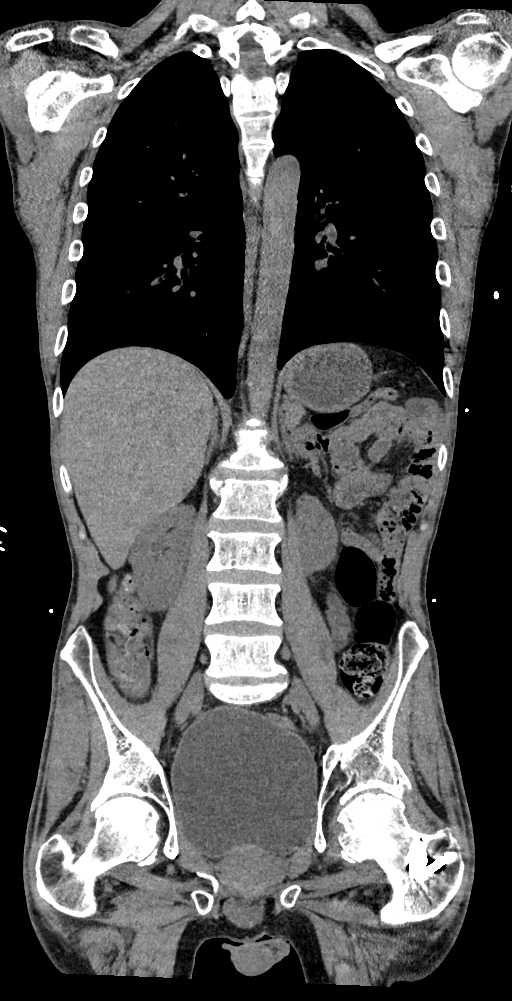

[13 of 36 positions shown; findings below may reference images not displayed]

FINDINGS: CT CHEST FINDINGS

Cardiovascular: Minimal left anterior descending coronary artery
atherosclerotic calcification. Small anterior pericardial effusion.

Mediastinum/Nodes: Unremarkable

Lungs/Pleura: Airway thickening is present, suggesting bronchitis or
reactive airways disease. 2 mm in diameter calcified granuloma in
the right lower lobe on image 125 series [DATE] by 0.3 cm sub solid
nodule in the right lower lobe on image 90 series 3. 2 mm calcified
granuloma in the left upper lobe, image 52 series 3.

Musculoskeletal: Old left third and fourth rib deformities.

CT ABDOMEN PELVIS FINDINGS

Hepatobiliary: Unremarkable

Pancreas: Unremarkable

Spleen: Unremarkable

Adrenals/Urinary Tract: The adrenal glands appear unremarkable.

Horseshoe kidney noted with bridging parenchyma below the inferior
mesenteric artery primarily arising from the left kidney part of
which is partially to the right of midline. There appear to be
single bilateral ureters. Mildly distended urinary bladder, probably
incidental. No urinary tract calculi are identified although there
is a punctate parenchymal calcification near the junction of the
fused right and left kidneys on image 82 series 4.

Stomach/Bowel: Mild prominence of gas and stool in the colon. No
dilated bowel or findings of malrotation.

Vascular/Lymphatic: Aortoiliac atherosclerotic vascular disease. No
pathologic adenopathy is identified.

Reproductive: Moderately proximally retracted right testicle along
the spermatic cord.

Other: No supplemental non-categorized findings.

Musculoskeletal: There are old deformities of the right pubic rami
and of the left acetabulum and left femoral head. Left femoral IM
nail with interlocking proximal screws noted. There is evidence of
bilateral femoral head avascular necrosis with some femoral head
irregularity on the left. Bilateral acetabular spurring, left
greater than right
IMPRESSION: 1. A specific cause for the patient's postprandial abdominal pain
and emesis is not identified. There is some mild prominence of gas
and stool in the colon but the bowel appears otherwise unremarkable.
2. Horseshoe kidney with bridging parenchyma primarily provided from
the left kidney which is slightly eccentric to the right.
3. Aortic Atherosclerosis ([id number]). Mild left anterior
descending coronary artery atherosclerosis.
4. Old pelvic deformities. Avascular necrosis of both femoral heads
with mild contour irregularity of the left femoral head.
5. Small anterior pericardial effusion.
6. Airway thickening is present, suggesting bronchitis or reactive
airways disease.
7. 4 mm sub solid nodule in the right lower lobe. No follow-up
recommended. This recommendation follows the consensus statement:
Guidelines for Management of Incidental Pulmonary Nodules Detected
